# Patient Record
Sex: Male | Born: 1960 | Race: Black or African American | Hispanic: No | Marital: Single | Smoking: Never smoker
Health system: Southern US, Community
[De-identification: ages and names within clinical notes are randomized; demographics above are authoritative.]

## PROBLEM LIST (undated history)

## (undated) DIAGNOSIS — F4310 Post-traumatic stress disorder, unspecified: Secondary | ICD-10-CM

## (undated) DIAGNOSIS — I10 Essential (primary) hypertension: Secondary | ICD-10-CM

## (undated) DIAGNOSIS — C61 Malignant neoplasm of prostate: Secondary | ICD-10-CM

## (undated) HISTORY — PX: BACK SURGERY: SHX140

---

## 2020-09-17 ENCOUNTER — Other Ambulatory Visit: Payer: Self-pay

## 2020-09-17 ENCOUNTER — Emergency Department (HOSPITAL_COMMUNITY)
Admission: EM | Admit: 2020-09-17 | Discharge: 2020-09-17 | Disposition: A | Discharge status: Home / Self Care | Payer: Non-veteran care | Attending: Emergency Medicine | Admitting: Emergency Medicine

## 2020-09-17 ENCOUNTER — Encounter (HOSPITAL_COMMUNITY): Payer: Self-pay | Admitting: *Deleted

## 2020-09-17 DIAGNOSIS — R1030 Lower abdominal pain, unspecified: Secondary | ICD-10-CM | POA: Insufficient documentation

## 2020-09-17 DIAGNOSIS — Z5900 Homelessness unspecified: Secondary | ICD-10-CM | POA: Diagnosis not present

## 2020-09-17 DIAGNOSIS — N5089 Other specified disorders of the male genital organs: Secondary | ICD-10-CM | POA: Diagnosis present

## 2020-09-17 DIAGNOSIS — Z5321 Procedure and treatment not carried out due to patient leaving prior to being seen by health care provider: Secondary | ICD-10-CM | POA: Insufficient documentation

## 2020-09-17 HISTORY — DX: Post-traumatic stress disorder, unspecified: F43.10

## 2020-09-17 HISTORY — DX: Malignant neoplasm of prostate: C61

## 2020-09-17 HISTORY — DX: Essential (primary) hypertension: I10

## 2020-09-17 NOTE — ED Triage Notes (Addendum)
BIB EMS Perineal pain and swelling, lower abd pain 10/10 due to pressure. 160/104-80-98%-15 pt is homeless.

## 2020-09-20 ENCOUNTER — Emergency Department (HOSPITAL_COMMUNITY): Payer: No Typology Code available for payment source

## 2020-09-20 ENCOUNTER — Emergency Department (HOSPITAL_COMMUNITY)
Admission: EM | Admit: 2020-09-20 | Discharge: 2020-09-20 | Disposition: A | Discharge status: Home / Self Care | Payer: No Typology Code available for payment source | Attending: Emergency Medicine | Admitting: Emergency Medicine

## 2020-09-20 DIAGNOSIS — N453 Epididymo-orchitis: Secondary | ICD-10-CM | POA: Diagnosis not present

## 2020-09-20 DIAGNOSIS — N433 Hydrocele, unspecified: Secondary | ICD-10-CM | POA: Diagnosis not present

## 2020-09-20 DIAGNOSIS — I10 Essential (primary) hypertension: Secondary | ICD-10-CM | POA: Diagnosis not present

## 2020-09-20 DIAGNOSIS — R1909 Other intra-abdominal and pelvic swelling, mass and lump: Secondary | ICD-10-CM | POA: Diagnosis present

## 2020-09-20 DIAGNOSIS — N5089 Other specified disorders of the male genital organs: Secondary | ICD-10-CM

## 2020-09-20 DIAGNOSIS — Z8546 Personal history of malignant neoplasm of prostate: Secondary | ICD-10-CM | POA: Diagnosis not present

## 2020-09-20 LAB — URINALYSIS, ROUTINE W REFLEX MICROSCOPIC
Bilirubin Urine: NEGATIVE
Glucose, UA: NEGATIVE mg/dL
Ketones, ur: NEGATIVE mg/dL
Nitrite: POSITIVE — AB
Protein, ur: NEGATIVE mg/dL
Specific Gravity, Urine: 1.014 (ref 1.005–1.030)
pH: 6 (ref 5.0–8.0)

## 2020-09-20 LAB — COMPREHENSIVE METABOLIC PANEL
ALT: 19 U/L (ref 0–44)
AST: 25 U/L (ref 15–41)
Albumin: 3.9 g/dL (ref 3.5–5.0)
Alkaline Phosphatase: 42 U/L (ref 38–126)
Anion gap: 10 (ref 5–15)
BUN: 12 mg/dL (ref 6–20)
CO2: 26 mmol/L (ref 22–32)
Calcium: 9.3 mg/dL (ref 8.9–10.3)
Chloride: 104 mmol/L (ref 98–111)
Creatinine, Ser: 1.28 mg/dL — ABNORMAL HIGH (ref 0.61–1.24)
GFR, Estimated: >60 mL/min (ref >60)
Glucose, Bld: 95 mg/dL (ref 70–99)
Potassium: 4.4 mmol/L (ref 3.5–5.1)
Sodium: 140 mmol/L (ref 135–145)
Total Bilirubin: 0.3 mg/dL (ref 0.3–1.2)
Total Protein: 6.8 g/dL (ref 6.5–8.1)

## 2020-09-20 LAB — CBC WITH DIFFERENTIAL/PLATELET
Abs Immature Granulocytes: 0.03 10*3/uL (ref 0.00–0.07)
Basophils Absolute: 0.1 10*3/uL (ref 0.0–0.1)
Basophils Relative: 1 %
Eosinophils Absolute: 0.3 10*3/uL (ref 0.0–0.5)
Eosinophils Relative: 4 %
HCT: 41.5 % (ref 39.0–52.0)
Hemoglobin: 13.8 g/dL (ref 13.0–17.0)
Immature Granulocytes: 0 %
Lymphocytes Relative: 25 %
Lymphs Abs: 1.7 10*3/uL (ref 0.7–4.0)
MCH: 29.7 pg (ref 26.0–34.0)
MCHC: 33.3 g/dL (ref 30.0–36.0)
MCV: 89.2 fL (ref 80.0–100.0)
Monocytes Absolute: 0.7 10*3/uL (ref 0.1–1.0)
Monocytes Relative: 10 %
Neutro Abs: 4.1 10*3/uL (ref 1.7–7.7)
Neutrophils Relative %: 60 %
Platelets: 370 10*3/uL (ref 150–400)
RBC: 4.65 MIL/uL (ref 4.22–5.81)
RDW: 14.1 % (ref 11.5–15.5)
WBC: 6.9 10*3/uL (ref 4.0–10.5)
nRBC: 0 % (ref 0.0–0.2)

## 2020-09-20 LAB — URINALYSIS, MICROSCOPIC (REFLEX)

## 2020-09-20 LAB — LIPASE, BLOOD: Lipase: 28 U/L (ref 11–51)

## 2020-09-20 MED ORDER — MORPHINE SULFATE (PF) 4 MG/ML IV SOLN
4.0000 mg | Freq: Once | INTRAVENOUS | Status: AC
  Administered 2020-09-20: 4 mg via INTRAVENOUS
  Filled 2020-09-20: qty 1

## 2020-09-20 MED ORDER — LEVOFLOXACIN 750 MG PO TABS
750.0000 mg | ORAL_TABLET | Freq: Once | ORAL | Status: AC
  Administered 2020-09-20: 750 mg via ORAL
  Filled 2020-09-20: qty 1

## 2020-09-20 MED ORDER — LEVOFLOXACIN 750 MG PO TABS: 750.0000 mg | ORAL_TABLET | Freq: Every day | ORAL | 0 refills | Status: AC

## 2020-09-20 MED ORDER — HYDROCODONE-ACETAMINOPHEN 5-325 MG PO TABS: 1.0000 | ORAL_TABLET | ORAL | 0 refills | Status: DC | PRN

## 2020-09-20 NOTE — ED Notes (Signed)
Ultrasound at bedside

## 2020-09-20 NOTE — ED Triage Notes (Signed)
Pt presents with c/o testicle swelling and pain for 2 days. Pt reports his entire scrotum is swollen.

## 2020-09-20 NOTE — ED Provider Notes (Signed)
[facility]-EMERGENCY DEPT Provider Note   CSN: [id number] Arrival date & time: 09/20/20  0818     History Chief Complaint  Patient presents with  . Groin Swelling    Clifford Edwards is a 60 y.o. male.  The history is provided by the patient and medical records. No language interpreter was used.  Groin Pain This is a new problem. The current episode started more than 2 days ago. The problem occurs constantly. The problem has been rapidly worsening. Pertinent negatives include no chest pain, no abdominal pain, no headaches and no shortness of breath. Nothing aggravates the symptoms. Nothing relieves the symptoms. He has tried nothing for the symptoms. The treatment provided no relief.       Past Medical History:  Diagnosis Date  . Hypertension   . Prostate cancer (HCC)   . PTSD (post-traumatic stress disorder)     There are no problems to display for this patient.    No family history on file.  Social History   Tobacco Use  . Smoking status: Never Smoker  . Smokeless tobacco: Never Used  Substance Use Topics  . Alcohol use: Yes  . Drug use: Not Currently    Home Medications Prior to Admission medications   Not on File    Allergies    Penicillins  Review of Systems   Review of Systems  Constitutional: Negative for chills, diaphoresis, fatigue and fever.  HENT: Negative for congestion.   Eyes: Negative for visual disturbance.  Respiratory: Negative for cough, chest tightness, shortness of breath and wheezing.   Cardiovascular: Negative for chest pain, palpitations and leg swelling.  Gastrointestinal: Negative for abdominal pain, constipation, diarrhea, nausea and vomiting.  Genitourinary: Positive for scrotal swelling and testicular pain. Negative for decreased urine volume, dysuria, flank pain, frequency, penile pain and penile swelling.  Musculoskeletal: Negative for back pain, neck pain and neck stiffness.  Skin: Negative for rash and  wound.  Neurological: Negative for dizziness, weakness, light-headedness and headaches.  Psychiatric/Behavioral: Negative for agitation and confusion.  All other systems reviewed and are negative.   Physical Exam Updated Vital Signs BP (!) 177/114 (BP Location: Right Arm)   Pulse 83   Temp 98.4 F (36.9 C) (Oral)   Resp 18   SpO2 97%   Physical Exam Vitals and nursing note reviewed. Exam conducted with a chaperone present.  Constitutional:      General: He is not in acute distress.    Appearance: He is well-developed and well-nourished. He is not ill-appearing, toxic-appearing or diaphoretic.  HENT:     Head: Normocephalic and atraumatic.     Nose: No congestion or rhinorrhea.     Mouth/Throat:     Mouth: Mucous membranes are moist.     Pharynx: No oropharyngeal exudate or posterior oropharyngeal erythema.  Eyes:     Conjunctiva/sclera: Conjunctivae normal.  Cardiovascular:     Rate and Rhythm: Normal rate and regular rhythm.     Heart sounds: No murmur heard.   Pulmonary:     Effort: Pulmonary effort is normal. No respiratory distress.     Breath sounds: Normal breath sounds. No wheezing, rhonchi or rales.  Chest:     Chest wall: No tenderness.  Abdominal:     General: Abdomen is flat. There is no distension.     Palpations: Abdomen is soft.     Tenderness: There is no abdominal tenderness. There is no right CVA tenderness, left CVA tenderness, guarding or rebound.  Genitourinary:  Pubic Area: No rash.      Penis: No erythema.      Testes:        Right: Tenderness not present.        Left: Tenderness present.    Musculoskeletal:        General: No tenderness or edema.     Cervical back: Neck supple. No tenderness.  Skin:    General: Skin is warm and dry.     Capillary Refill: Capillary refill takes less than 2 seconds.     Findings: No erythema or rash.  Neurological:     General: No focal deficit present.     Mental Status: He is alert.  Psychiatric:         Mood and Affect: Mood and affect and mood normal.     ED Results / Procedures / Treatments   Labs (all labs ordered are listed, but only abnormal results are displayed) Labs Reviewed  COMPREHENSIVE METABOLIC PANEL - Abnormal; Notable for the following components:      Result Value   Creatinine, Ser 1.28 (*)    All other components within normal limits  URINALYSIS, ROUTINE W REFLEX MICROSCOPIC - Abnormal; Notable for the following components:   Hgb urine dipstick MODERATE (*)    Nitrite POSITIVE (*)    Leukocytes,Ua MODERATE (*)    All other components within normal limits  URINALYSIS, MICROSCOPIC (REFLEX) - Abnormal; Notable for the following components:   Bacteria, UA MANY (*)    All other components within normal limits  URINE CULTURE  CBC WITH DIFFERENTIAL/PLATELET  LIPASE, BLOOD  URINALYSIS, ROUTINE W REFLEX MICROSCOPIC    EKG None  Radiology US SCROTUM W/DOPPLER  Result Date: 09/20/2020 CLINICAL DATA:  BILATERAL scrotal pain and swelling for 2-3 days, pain greater on LEFT EXAM: SCROTAL ULTRASOUND DOPPLER ULTRASOUND OF THE TESTICLES TECHNIQUE: Complete ultrasound examination of the testicles, epididymis, and other scrotal structures was performed. Color and spectral Doppler ultrasound were also utilized to evaluate blood flow to the testicles. COMPARISON:  None FINDINGS: Right testicle Measurements: 3.4 x 1.5 x 3.1 cm. Normal echogenicity without mass or calcification. Internal blood flow present on color Doppler imaging. Left testicle Measurements: 4.4 x 2.4 x 2.9 cm. Normal echogenicity without mass or calcification. Internal blood flow present on color Doppler imaging. Blood flow within the LEFT testis is increased versus the RIGHT Right epididymis:  Normal in size and appearance. Left epididymis: Normal in size and appearance. Mildly hypervascular versus RIGHT Hydrocele: Small RIGHT hydrocele. Larger LEFT hydrocele which is complicated by multiple thin septations. No  hypervascularity within the septations or surrounding the LEFT hydrocele. No significant complexity/echogenicity of the internal fluid. Varicocele:  None visualized. Pulsed Doppler interrogation of both testes demonstrates normal low resistance arterial and venous waveforms bilaterally. IMPRESSION: No evidence of testicular mass or torsion. Hypervascular versus LEFT testis and epididymis consistent with LEFT epididymo-orchitis. Larger complicated LEFT hydrocele containing multiple thin septations, suspect mildly complicated LEFT hydrocele but cannot completely exclude pyocele though considered less likely. Small RIGHT hydrocele. Electronically Signed   By: Ulyses Southward M.D.   On: 09/20/2020 09:37    Procedures Procedures (including critical care time)  Medications Ordered in ED Medications  morphine 4 MG/ML injection 4 mg (4 mg Intravenous Given 09/20/20 0905)  levofloxacin (LEVAQUIN) tablet 750 mg (750 mg Oral Given 09/20/20 1218)    ED Course  I have reviewed the triage vital signs and the nursing notes.  Pertinent labs & imaging results that were available during  my care of the patient were reviewed by me and considered in my medical decision making (see chart for details).    MDM Rules/Calculators/A&P                          Ekene Barrington is a 60 y.o. male with a past medical history significant for hypertension, PTSD, prior back surgery, and report of prostate cancer who presents with groin pain.  Patient reports that for the last 3 days, he has had waxing waning but worsening pain in his left groin and scrotum.  He has never had this pain before.  He reports he has had cancer before and had some abdominal pain but he reports this feels different.  He reports no nausea or vomiting.  He reports he is having more difficult time with urination but it is not burning.  No purulence or pus.  No rashes on the groin.  He reports his groin and scrotum was bulging before but now it is not it is just  hurting now.  He denies any history of torsion.  Denies any history of groin trauma or accidents.  He reports no history of sexually transmitted infection or new sexual contacts.  He is not concerned about STI.  No history of hernia reported.  He reports he did not do any heavy lifting before this pain started.  He describes as 10 out of 10 in severity with some waxing and waning worsening over the last few days.  He denies penile ulcers or any redness of the skin.  Denies any fevers, chills, chest pain, shortness of breath, or URI symptoms.  Denies upper abdominal pain.  Reports he has had slightly less urine.   On exam with a chaperone, left hemiscrotum and inguinal area is tender to palpation.  It was difficult to do palpation of the inguinal area due to the patient's severe pain however I did not appreciate initial lymphadenopathy.  No tenderness to the penis or any ulcers or rashes seen.  Left hemiscrotum is tender and it is difficult to assess cremaster reflex.  Right scrotum is nontender.  Right inguinal area nontender.  Abdomen otherwise nontender with normal bowel sounds.  Flanks nontender.  No rashes.  Patient will be given pain medicine and will have a testicular ultrasound.  We will get urinalysis and culture due to the change in urine amount.  We will get screening labs as well.  Anticipate reassessment after work-up.  10:16 AM Ultrasound returned and does not show evidence of testicular mass or torsion but does show hypervascular left testis and epididymis concerning for left epididymoorchitis.  There is also a complicated left hydrocele containing septations which cannot exclude a pyocele.  We will touch base with urology to discuss if further imaging such as CT would be needed.  Labs began to return and or grossly reassuring aside from slight elevation in creatinine of 1.28 (normal GFR.  No leukocytosis and no fever.  No other infectious symptoms reported.  10:51 AM Urology was called  who looked at the ultrasound images himself.  He suspects this is just a hydrocele with no evidence of abscess or pyocele.  He suspects this is all the epididymoorchitis and associated edema.  He recommended antibiotics for the patient and follow-up in clinic.  He does not feel CT imaging or further work-up is needed at this time.  Patient agreed with this plan, he is awaiting the urinalysis results.  He will be  given a dose of Levaquin and given a prescription for Levaquin and pain medication.  Patient agrees with plan and will follow up with his [facility] team.  I will also give him the outpatient [facility] team information [facility].    Final Clinical Impression(s) / ED Diagnoses Final diagnoses:  Epididymo-orchitis without abscess  Scrotal swelling  Hydrocele in adult    Rx / DC Orders ED Discharge Orders         Ordered    levofloxacin (LEVAQUIN) 750 MG tablet  Daily        09/20/20 1211    HYDROcodone-acetaminophen (NORCO/VICODIN) 5-325 MG tablet  Every 4 hours PRN        09/20/20 1211         Clinical Impression: 1. Epididymo-orchitis without abscess   2. Scrotal swelling   3. Hydrocele in adult     Disposition: Discharge  Condition: Good  I have discussed the results, Dx and Tx plan with the pt(& family if present). He/she/they expressed understanding and agree(s) with the plan. Discharge instructions discussed at great length. Strict return precautions discussed and pt &/or family have verbalized understanding of the instructions. No further questions at time of discharge.    Discharge Medication List as of 09/20/2020 12:13 PM    START taking these medications   Details  HYDROcodone-acetaminophen (NORCO/VICODIN) 5-325 MG tablet Take 1 tablet by mouth every 4 (four) hours as needed., Starting Mon 09/20/2020, Normal    levofloxacin (LEVAQUIN) 750 MG tablet Take 1 tablet (750 mg total) by mouth daily for 10 days., Starting Mon 09/20/2020, Until Thu 09/30/2020,  Print        Follow Up: Center, [facility] [place] [id number] [phone]     [place] urology team        Daneisha Surges, Gwenyth Allegra, MD 09/20/20 1230

## 2020-09-20 NOTE — Discharge Instructions (Signed)
Your ultrasound work-up today are consistent with an epididymoorchitis or infection of the testicle and epididymis with associated fluid causing a hydrocele.  The ultrasound was reviewed by the urology team and they do not suspect abscess or surgical problem at this time.  Please take antibiotics for the next 10 days to treat this.  Please use the pain medicine to help with your discomfort.  Please follow-up with your outpatient urology team with the [facility] and if for some reason you cannot get in, consider calling the [facility] here in [place].  If any symptoms change or worsen, please return to the nearest emergency department.

## 2020-09-22 LAB — URINE CULTURE: Culture: >=100000 — AB

## 2020-09-23 ENCOUNTER — Telehealth: Payer: Self-pay | Admitting: Emergency Medicine

## 2020-09-23 NOTE — Telephone Encounter (Signed)
Post ED Visit - Positive Culture Follow-up  Culture report reviewed by antimicrobial stewardship pharmacist: [facility] []  , Pharm.D. []  Enzo Bi, Pharm.D., BCPS AQ-ID []  , Pharm.D., BCPS []  Celedonio Miyamoto, Pharm.D., BCPS []  [place], Garvin Fila.D., BCPS, AAHIVP []  , Pharm.D., BCPS, AAHIVP []  Georgina Pillion, PharmD, BCPS []  , PharmD, BCPS []  [place], PharmD, BCPS []  [address], PharmD []  , PharmD, BCPS []  Estella Husk, PharmD  Pharmacy Team []  Lysle Pearl, PharmD []  , PharmD []  Phillips Climes, PharmD []  , Rph []  Agapito Games) , PharmD []  Verlan Friends, PharmD []  , PharmD []  Mervyn Gay, PharmD []  , PharmD []  Vinnie Level, PharmD []  [facility], PharmD []  , PharmD []  Len Childs, PharmD PharmD   Positive urine culture Treated with levofloxacin, organism sensitive to the same and no further patient follow-up is required at this time.  Greer Pickerel 09/23/2020, 9:48 AM

## 2021-02-19 ENCOUNTER — Encounter (HOSPITAL_COMMUNITY): Payer: Self-pay

## 2021-02-19 ENCOUNTER — Emergency Department (HOSPITAL_COMMUNITY): Payer: No Typology Code available for payment source

## 2021-02-19 ENCOUNTER — Emergency Department (HOSPITAL_COMMUNITY)
Admission: EM | Admit: 2021-02-19 | Discharge: 2021-02-21 | Disposition: A | Discharge status: Home / Self Care | Payer: No Typology Code available for payment source | Attending: Emergency Medicine | Admitting: Emergency Medicine

## 2021-02-19 DIAGNOSIS — R079 Chest pain, unspecified: Secondary | ICD-10-CM | POA: Diagnosis not present

## 2021-02-19 DIAGNOSIS — R440 Auditory hallucinations: Secondary | ICD-10-CM | POA: Diagnosis present

## 2021-02-19 DIAGNOSIS — F4312 Post-traumatic stress disorder, chronic: Secondary | ICD-10-CM | POA: Insufficient documentation

## 2021-02-19 DIAGNOSIS — Z20822 Contact with and (suspected) exposure to covid-19: Secondary | ICD-10-CM | POA: Diagnosis not present

## 2021-02-19 DIAGNOSIS — R45850 Homicidal ideations: Secondary | ICD-10-CM | POA: Insufficient documentation

## 2021-02-19 LAB — BASIC METABOLIC PANEL
Anion gap: 7 (ref 5–15)
BUN: 13 mg/dL (ref 6–20)
CO2: 27 mmol/L (ref 22–32)
Calcium: 9.1 mg/dL (ref 8.9–10.3)
Chloride: 102 mmol/L (ref 98–111)
Creatinine, Ser: 1.06 mg/dL (ref 0.61–1.24)
GFR, Estimated: >60 mL/min (ref >60)
Glucose, Bld: 87 mg/dL (ref 70–99)
Potassium: 4 mmol/L (ref 3.5–5.1)
Sodium: 136 mmol/L (ref 135–145)

## 2021-02-19 LAB — ACETAMINOPHEN LEVEL: Acetaminophen (Tylenol), Serum: <10 ug/mL — ABNORMAL LOW (ref 10–30)

## 2021-02-19 LAB — RAPID URINE DRUG SCREEN, HOSP PERFORMED
Amphetamines: NOT DETECTED
Barbiturates: NOT DETECTED
Benzodiazepines: NOT DETECTED
Cocaine: POSITIVE — AB
Opiates: NOT DETECTED
Tetrahydrocannabinol: POSITIVE — AB

## 2021-02-19 LAB — CBC WITH DIFFERENTIAL/PLATELET
Abs Immature Granulocytes: 0.02 10*3/uL (ref 0.00–0.07)
Basophils Absolute: 0 10*3/uL (ref 0.0–0.1)
Basophils Relative: 1 %
Eosinophils Absolute: 0.2 10*3/uL (ref 0.0–0.5)
Eosinophils Relative: 4 %
HCT: 39.5 % (ref 39.0–52.0)
Hemoglobin: 13.3 g/dL (ref 13.0–17.0)
Immature Granulocytes: 0 %
Lymphocytes Relative: 30 %
Lymphs Abs: 1.8 10*3/uL (ref 0.7–4.0)
MCH: 30.4 pg (ref 26.0–34.0)
MCHC: 33.7 g/dL (ref 30.0–36.0)
MCV: 90.4 fL (ref 80.0–100.0)
Monocytes Absolute: 0.5 10*3/uL (ref 0.1–1.0)
Monocytes Relative: 9 %
Neutro Abs: 3.4 10*3/uL (ref 1.7–7.7)
Neutrophils Relative %: 56 %
Platelets: 283 10*3/uL (ref 150–400)
RBC: 4.37 MIL/uL (ref 4.22–5.81)
RDW: 14.1 % (ref 11.5–15.5)
WBC: 5.9 10*3/uL (ref 4.0–10.5)
nRBC: 0 % (ref 0.0–0.2)

## 2021-02-19 LAB — RESP PANEL BY RT-PCR (FLU A&B, COVID) ARPGX2
Influenza A by PCR: NEGATIVE
Influenza B by PCR: NEGATIVE
SARS Coronavirus 2 by RT PCR: NEGATIVE

## 2021-02-19 LAB — ETHANOL: Alcohol, Ethyl (B): <10 mg/dL (ref <10)

## 2021-02-19 LAB — TROPONIN I (HIGH SENSITIVITY)
Troponin I (High Sensitivity): 9 ng/L (ref <18)
Troponin I (High Sensitivity): 10 ng/L (ref <18)

## 2021-02-19 MED ORDER — ALUM & MAG HYDROXIDE-SIMETH 200-200-20 MG/5ML PO SUSP: 30.0000 mL | Freq: Four times a day (QID) | ORAL | Status: DC | PRN

## 2021-02-19 MED ORDER — STERILE WATER FOR INJECTION IJ SOLN
INTRAMUSCULAR | Status: AC
  Filled 2021-02-19: qty 10

## 2021-02-19 MED ORDER — VITAMIN D 25 MCG (1000 UNIT) PO TABS
1000.0000 [IU] | ORAL_TABLET | Freq: Every day | ORAL | Status: DC
  Administered 2021-02-20 – 2021-02-21 (×2): 1000 [IU] via ORAL
  Filled 2021-02-19 (×2): qty 1

## 2021-02-19 MED ORDER — IBUPROFEN 800 MG PO TABS
800.0000 mg | ORAL_TABLET | Freq: Three times a day (TID) | ORAL | Status: DC | PRN
  Administered 2021-02-20 – 2021-02-21 (×2): 800 mg via ORAL
  Filled 2021-02-19 (×3): qty 1

## 2021-02-19 MED ORDER — ASPIRIN 81 MG PO CHEW
324.0000 mg | CHEWABLE_TABLET | Freq: Once | ORAL | Status: DC
  Filled 2021-02-19: qty 4

## 2021-02-19 MED ORDER — NICOTINE 21 MG/24HR TD PT24
21.0000 mg | MEDICATED_PATCH | Freq: Every day | TRANSDERMAL | Status: DC
  Administered 2021-02-21: 21 mg via TRANSDERMAL
  Filled 2021-02-19 (×2): qty 1

## 2021-02-19 MED ORDER — ZIPRASIDONE MESYLATE 20 MG IM SOLR
20.0000 mg | Freq: Once | INTRAMUSCULAR | Status: AC
  Administered 2021-02-19: 20 mg via INTRAMUSCULAR
  Filled 2021-02-19: qty 20

## 2021-02-19 MED ORDER — OMEGA-3-ACID ETHYL ESTERS 1 G PO CAPS
1.0000 g | ORAL_CAPSULE | Freq: Two times a day (BID) | ORAL | Status: DC
  Administered 2021-02-19 – 2021-02-21 (×4): 1 g via ORAL
  Filled 2021-02-19 (×5): qty 1

## 2021-02-19 MED ORDER — DICLOFENAC SODIUM 1 % EX GEL
4.0000 g | Freq: Four times a day (QID) | CUTANEOUS | Status: DC | PRN
  Administered 2021-02-20: 4 g via TOPICAL
  Filled 2021-02-19: qty 100

## 2021-02-19 MED ORDER — ONDANSETRON HCL 4 MG PO TABS: 4.0000 mg | ORAL_TABLET | Freq: Three times a day (TID) | ORAL | Status: DC | PRN

## 2021-02-19 MED ORDER — TRAZODONE HCL 50 MG PO TABS
50.0000 mg | ORAL_TABLET | Freq: Every day | ORAL | Status: DC
  Administered 2021-02-20: 50 mg via ORAL
  Filled 2021-02-19: qty 1

## 2021-02-19 MED ORDER — KETOROLAC TROMETHAMINE 15 MG/ML IJ SOLN
15.0000 mg | Freq: Once | INTRAMUSCULAR | Status: AC
  Administered 2021-02-19: 15 mg via INTRAVENOUS
  Filled 2021-02-19: qty 1

## 2021-02-19 MED ORDER — DULOXETINE HCL 30 MG PO CPEP
30.0000 mg | ORAL_CAPSULE | Freq: Every day | ORAL | Status: DC
  Administered 2021-02-20: 30 mg via ORAL
  Filled 2021-02-19 (×2): qty 1

## 2021-02-19 MED ORDER — IBUPROFEN 800 MG PO TABS: 800.0000 mg | ORAL_TABLET | Freq: Three times a day (TID) | ORAL | Status: DC | PRN

## 2021-02-19 MED ORDER — HYDRALAZINE HCL 10 MG PO TABS
10.0000 mg | ORAL_TABLET | Freq: Two times a day (BID) | ORAL | Status: DC
  Administered 2021-02-20 – 2021-02-21 (×3): 10 mg via ORAL
  Filled 2021-02-19 (×3): qty 1

## 2021-02-19 MED ORDER — AMLODIPINE BESYLATE 5 MG PO TABS
10.0000 mg | ORAL_TABLET | Freq: Every day | ORAL | Status: DC
  Administered 2021-02-20 – 2021-02-21 (×2): 10 mg via ORAL
  Filled 2021-02-19 (×2): qty 2

## 2021-02-19 MED ORDER — ACETAMINOPHEN 325 MG PO TABS: 650.0000 mg | ORAL_TABLET | ORAL | Status: DC | PRN

## 2021-02-19 MED ORDER — QUETIAPINE FUMARATE 200 MG PO TABS
200.0000 mg | ORAL_TABLET | Freq: Every day | ORAL | Status: DC
  Administered 2021-02-20: 200 mg via ORAL
  Filled 2021-02-19 (×3): qty 1

## 2021-02-19 MED ORDER — TAMSULOSIN HCL 0.4 MG PO CAPS
0.4000 mg | ORAL_CAPSULE | Freq: Every day | ORAL | Status: DC
  Administered 2021-02-20 – 2021-02-21 (×2): 0.4 mg via ORAL
  Filled 2021-02-19 (×2): qty 1

## 2021-02-19 MED ORDER — HYDROCHLOROTHIAZIDE 25 MG PO TABS
12.5000 mg | ORAL_TABLET | Freq: Every day | ORAL | Status: DC
  Administered 2021-02-20 – 2021-02-21 (×2): 12.5 mg via ORAL
  Filled 2021-02-19 (×2): qty 1

## 2021-02-19 NOTE — ED Provider Notes (Signed)
Emergency Medicine Provider Triage Evaluation Note  Clifford Edwards , a 60 y.o. male  was evaluated in triage.  Pt complains of chest pain and homicidal ideation with plan. Psych history managed at [place].  Ran out of medicines.  Thinking of using his fire arms to kill people. Called his brother to take them away.  Sudden onset left burning chest pain constant. Denies SI. Also reports pain all over his body where he has had surgeries in neck, thoracic and lumbar back and right knee   Review of Systems  Positive: CP, HI Negative: SI  Physical Exam  There were no vitals taken for this visit. Gen:   Awake, no distress   Resp:  Normal effort  MSK:   Moves extremities without difficulty  Cardiac: RRR. Normal skin over chest    Medical Decision Making    Patient made aware this encounter is a triage and screening encounter and no beds are immediately available at this time in the ER.  Patient was informed that the remainder of the evaluation will be completed by another provider.  Patient made aware triage orders have been placed and patient will be placed in the waiting room while work up is initiated and until a room becomes available. Patient encouraged to await a formal ER encounter with a clinician.  Patient made aware that exiting the department prior to formal encounter with an ER clinician and completion of the work-up is considered leaving against medical advice.  At that time there is no guarantee that there are no emergency medical conditions present and patient assumes risks of leaving including worsening condition, permanent disability and death. Patient verbalizes understanding.     Kinnie Feil, PA-C 02/19/21 1443    Valarie Merino, MD 02/20/21 [phone]

## 2021-02-19 NOTE — ED Notes (Signed)
Pt denying CP.
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
## 2021-02-19 NOTE — ED Notes (Signed)
Patient changed into purple scrubs. 2 bag to locker 14, Patient wanded  by security.

## 2021-02-19 NOTE — ED Notes (Signed)
Geodon IM given for agitation / attempting to leave , security assisted to hold patient .

## 2021-02-19 NOTE — ED Provider Notes (Signed)
[facility] EMERG[facility] DEPARTMENT Provider Note   CSN: [id number] Arrival date & time: 02/19/21  1405     History Chief Complaint  Patient presents with  . BH/HI/CP    Clifford Edwards is a 60 y.o. male.  HPI 60 year old male presents needing psychiatric help.  For the last 2 days he has been out of his meds which she states tends to happen because of issues with the mailing process.  He cannot tell me which meds he is on and states there should be a list.  Unfortunately this is not in our med rec.  He is feeling like he wants to hurt people and has been having auditory and visual hallucinations.  This is a recurring issue whenever he runs out of his meds.  He also states that he is been having some left lateral chest pain all day today.  Feels like it is bruised.  Past Medical History:  Diagnosis Date  . Hypertension   . Prostate cancer ([place])   . PTSD (post-traumatic stress disorder)     There are no problems to display for this patient.   Past Surgical History:  Procedure Laterality Date  . BACK SURGERY         No family history on file.  Social History   Tobacco Use  . Smoking status: Never Smoker  . Smokeless tobacco: Never Used  Substance Use Topics  . Alcohol use: Yes  . Drug use: Not Currently    Home Medications Prior to Admission medications   Medication Sig Start Date End Date Taking? Authorizing Provider  amLODipine (NORVASC) 10 MG tablet Take 10 mg by mouth daily.    [provider]  Cholecalciferol (VITAMIN D3) 25 MCG (1000 UT) CAPS Take 1,000 Units by mouth daily.    [provider]  DULoxetine (CYMBALTA) 30 MG capsule Take 30 mg by mouth daily with supper.    [provider]  hydrALAZINE (APRESOLINE) 10 MG tablet Take 10 mg by mouth 2 (two) times daily.    [provider]  hydrochlorothiazide (HYDRODIURIL) 12.5 MG tablet Take 12.5 mg by mouth daily.    [provider]   HYDROcodone-acetaminophen (NORCO/VICODIN) 5-325 MG tablet Take 1 tablet by mouth every 4 (four) hours as needed. 09/20/20   Tegeler, Gwenyth Allegra, MD  ibuprofen (ADVIL) 800 MG tablet Take 800 mg by mouth 3 (three) times daily as needed (for pain and/or inflammation).    [provider]  Omega-3 Fatty Acids (FISH OIL) 1000 MG CAPS Take 1,000 mg by mouth daily.    [provider]  oxyCODONE-acetaminophen (PERCOCET/ROXICET) 5-325 MG tablet Take 1 tablet by mouth every 8 (eight) hours as needed for severe pain or moderate pain.    [provider]  QUEtiapine (SEROQUEL) 400 MG tablet Take 200 mg by mouth at bedtime.    [provider]  tamsulosin (FLOMAX) 0.4 MG CAPS capsule Take 0.4 mg by mouth daily.    [provider]  traZODone (DESYREL) 50 MG tablet Take 50 mg by mouth at bedtime.    [provider]  VOLTAREN 1 % GEL Apply 4 g topically See admin instructions. Apply 4 grams to painful sites two times a day and at bedtime    [provider]    Allergies    Penicillins  Review of Systems   Review of Systems  Cardiovascular: Positive for chest pain.  Gastrointestinal: Negative for abdominal pain.  Psychiatric/Behavioral: Positive for hallucinations. Negative for suicidal ideas.  All other systems reviewed and are negative.   Physical Exam Updated Vital Signs BP (!) 157/106 (BP Location: Right Arm)   Pulse 68   Temp 98 F (36.7 C) (Oral)   Resp 16   SpO2 100%   Physical Exam Vitals and nursing note reviewed.  Constitutional:      General: He is not in acute distress.    Appearance: He is well-developed. He is not ill-appearing or diaphoretic.  HENT:     Head: Normocephalic and atraumatic.     Right Ear: External ear normal.     Left Ear: External ear normal.     Nose: Nose normal.  Eyes:     General:        Right eye: No discharge.        Left eye: No discharge.  Cardiovascular:     Rate and Rhythm: Normal rate  and regular rhythm.     Heart sounds: Normal heart sounds.  Pulmonary:     Effort: Pulmonary effort is normal.     Breath sounds: Normal breath sounds.  Abdominal:     Palpations: Abdomen is soft.     Tenderness: There is no abdominal tenderness.  Musculoskeletal:     Cervical back: Neck supple.  Skin:    General: Skin is warm and dry.  Neurological:     Mental Status: He is alert.  Psychiatric:        Mood and Affect: Mood is not anxious.        Thought Content: Thought content does not include suicidal ideation. Thought content does not include suicidal plan.     ED Results / Procedures / Treatments   Labs (all labs ordered are listed, but only abnormal results are displayed) Labs Reviewed  ACETAMINOPHEN LEVEL - Abnormal; Notable for the following components:      Result Value   Acetaminophen (Tylenol), Serum <10 (*)    All other components within normal limits  RAPID URINE DRUG SCREEN, HOSP PERFORMED - Abnormal; Notable for the following components:   Cocaine POSITIVE (*)    Tetrahydrocannabinol POSITIVE (*)    All other components within normal limits  RESP PANEL BY RT-PCR (FLU A&B, COVID) ARPGX2  BASIC METABOLIC PANEL  CBC WITH DIFFERENTIAL/PLATELET  ETHANOL  TROPONIN I (HIGH SENSITIVITY)  TROPONIN I (HIGH SENSITIVITY)    EKG EKG Interpretation  Date/Time:  Saturday February 19 2021 14:28:03 EDT Ventricular Rate:  73 PR Interval:  138 QRS Duration: 86 QT Interval:  392 QTC Calculation: 431 R Axis:   64 Text Interpretation: Sinus rhythm with Premature atrial complexes Otherwise normal ECG Confirmed by Sherwood Gambler [phone]) on 02/19/2021 4:37:01 PM   Radiology DG Chest 2 View  Result Date: 02/19/2021 CLINICAL DATA:  Chest pain EXAM: CHEST - 2 VIEW COMPARISON:  None. FINDINGS: There is a small granuloma in the right apex. There is no edema or airspace opacity. Heart size and pulmonary vascularity are normal. No adenopathy. There is postoperative change in the  lower cervical spine. There are foci of degenerative change in the thoracic spine. IMPRESSION: No edema or airspace opacity. Calcified granuloma right apex. Cardiac silhouette normal. No evident adenopathy. Electronically Signed   By: Lowella Grip III M.D.   On: 02/19/2021 15:21    Procedures Procedures   Medications Ordered in ED Medications  aspirin chewable tablet 324 mg (324 mg Oral Not Given 02/19/21 1919)  acetaminophen (TYLENOL) tablet 650 mg (has no administration in time range)  ondansetron (ZOFRAN) tablet 4 mg (  has no administration in time range)  alum & mag hydroxide-simeth (MAALOX/MYLANTA) 200-200-20 MG/5ML suspension 30 mL (has no administration in time range)  nicotine (NICODERM CQ - dosed in mg/24 hours) patch 21 mg (21 mg Transdermal Not Given 02/19/21 1919)  ketorolac (TORADOL) 15 MG/ML injection 15 mg (15 mg Intravenous Given 02/19/21 1920)    ED Course  I have reviewed the triage vital signs and the nursing notes.  Pertinent labs & imaging results that were available during my care of the patient were reviewed by me and considered in my medical decision making (see chart for details).    MDM Rules/Calculators/A&P                          Patient's medical work-up is unremarkable and he appears stable for psychiatric disposition.  Troponins are negative x2.  Otherwise he is not agitated here.  We will reorder home meds. Final Clinical Impression(s) / ED Diagnoses Final diagnoses:  Homicidal ideation  Nonspecific chest pain    Rx / DC Orders ED Discharge Orders    None       Sherwood Gambler, MD 02/19/21 2324

## 2021-02-19 NOTE — ED Triage Notes (Signed)
Per EMS [facility] at [facility] today and was asked to not come back. Patient then complains of CP and made suicidal threats to [facility]. Patient has made SI/HI statements to EMS as well while enroute

## 2021-02-20 NOTE — ED Notes (Signed)
Pt currently eating dinner. TTS counselor would like to reassess. Told them I would place pt in a room w/ TTS when I get a moment to.

## 2021-02-20 NOTE — Progress Notes (Signed)
Per Letitia Libra FNP, patient meets criteria for inpatient treatment. There are no available beds at [facility] currently. CSW faxed referrals to the following facilities for review:  [facility]  TTS will continue to seek bed placement.   Maxie Better, MSW, LCSW Clinical Social Worker 02/20/2021 8:12 AM
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
## 2021-02-20 NOTE — ED Notes (Signed)
Pt in 48 being TTS'd at this time

## 2021-02-20 NOTE — Progress Notes (Signed)
CSW spoke with Roselyn Reef in admissions at [facility]. Pt has been accepted for admission for arrival AFTER 8:00AM on Monday, 6/6 to [facility]. Accepting provider is Dr. Sharlene Motts MD. Number for report is: [phone]. CSW spoke with Sammuel Bailiff RN and provided her with the above information.    Marisela Line S. Ouida Sills, MSW, LCSW Clinical Social Worker 02/20/2021 9:50 AM

## 2021-02-20 NOTE — ED Notes (Signed)
Notified counselor that pt is ready for TTS

## 2021-02-20 NOTE — ED Notes (Signed)
Attempted to call pt's son per pt request to notify pt's son that he is here and not missing or dead. No answer for the number in the chart and pt gave me another number that was a number for a tax place.

## 2021-02-20 NOTE — ED Notes (Signed)
[facility] tomorrow after [address]. Accepting provider Jonelle Sports MD.

## 2021-02-20 NOTE — ED Notes (Signed)
Inpt tx Breakfast Ordered

## 2021-02-21 NOTE — ED Notes (Signed)
From TTS note yesterdaty:   Disposition:  Merlyn Lot, NP, continues to recommend inpatient treatment. "CSW spoke with Roselyn Reef in admissions at [facility]. Pt has beenaccepted for admission for arrival AFTER 8:00AM on Monday, 6/6 to [facility].Accepting provider is Dr. Sharlene Motts MD. Number for report is: [phone]. CSW spoke with Sammuel Bailiff RN and provided her with the above information"  Pt sleeping/ resting, NAD, calm, interactive, breakfast tray arrived. Reports generalized pain 10/10.

## 2021-02-21 NOTE — ED Notes (Signed)
Pt resting comfortably in bed. Pt calm and cooperative with staff  Respirations are even and non-labored, pt does not appear in distress

## 2021-02-21 NOTE — Discharge Instructions (Addendum)
Transfer to [facility] 

## 2021-02-21 NOTE — ED Notes (Signed)
Checked in with security to make sure no belonging were locked up

## 2021-02-21 NOTE — ED Provider Notes (Signed)
Emergency Medicine Observation Re-evaluation Note  Clifford Edwards is a 60 y.o. male, seen on rounds today.  Pt initially presented to the ED for complaints of behavioral health symptoms. Patient has been accepted to [facility]. No new c/o this AM. Pt alert, content.   Physical Exam  BP 120/64 (BP Location: Left Arm)   Pulse 78   Temp 97.7 F (36.5 C) (Oral)   Resp 16   Ht 1.829 m (6')   Wt 91.6 kg   SpO2 94%   BMI 27.39 kg/m  Physical Exam General: alert, content.  Cardiac: regular rate.  Lungs: breathing comfortably. Psych: calm, alert.   ED Course / MDM    I have reviewed the labs performed to date as well as medications administered while in observation.  Recent changes in the last 24 hours include stabilization on BH meds and placement.   Plan  Current plan is for transfer to [facility], pt accepted this AM, Dr Manus Rudd.   Pt current appears stable for transfer.     Lajean Saver, MD 02/21/21 [phone]

## 2021-02-21 NOTE — ED Notes (Signed)
Belongings, 2 bags from locker 14 retrieved, to go with pt.

## 2021-02-21 NOTE — ED Notes (Signed)
Sent in paperwork for safe transport.

## 2021-02-21 NOTE — ED Notes (Signed)
Transport no longer here. Pt brought back to room 42. Pending transport. No changes.

## 2021-02-21 NOTE — ED Notes (Signed)
Pt ambulatory, NAD, calm, interactive, steady gait. Out with safe transport.

## 2021-05-02 ENCOUNTER — Other Ambulatory Visit: Payer: Self-pay

## 2021-05-02 ENCOUNTER — Emergency Department (HOSPITAL_COMMUNITY)
Admission: EM | Admit: 2021-05-02 | Discharge: 2021-05-05 | Disposition: A | Discharge status: Other Acute Inpatient Hospital | Payer: No Typology Code available for payment source | Attending: Emergency Medicine | Admitting: Emergency Medicine

## 2021-05-02 ENCOUNTER — Ambulatory Visit (HOSPITAL_COMMUNITY)
Admission: EM | Admit: 2021-05-02 | Discharge: 2021-05-02 | Disposition: A | Discharge status: Other Acute Inpatient Hospital | Payer: No Typology Code available for payment source | Attending: Student in an Organized Health Care Education/Training Program | Admitting: Student in an Organized Health Care Education/Training Program

## 2021-05-02 DIAGNOSIS — I10 Essential (primary) hypertension: Secondary | ICD-10-CM | POA: Diagnosis not present

## 2021-05-02 DIAGNOSIS — R45850 Homicidal ideations: Secondary | ICD-10-CM

## 2021-05-02 DIAGNOSIS — Y900 Blood alcohol level of less than 20 mg/100 ml: Secondary | ICD-10-CM | POA: Insufficient documentation

## 2021-05-02 DIAGNOSIS — M545 Low back pain, unspecified: Secondary | ICD-10-CM | POA: Insufficient documentation

## 2021-05-02 DIAGNOSIS — G8929 Other chronic pain: Secondary | ICD-10-CM | POA: Diagnosis not present

## 2021-05-02 DIAGNOSIS — R39198 Other difficulties with micturition: Secondary | ICD-10-CM

## 2021-05-02 DIAGNOSIS — F332 Major depressive disorder, recurrent severe without psychotic features: Secondary | ICD-10-CM | POA: Insufficient documentation

## 2021-05-02 DIAGNOSIS — Z20822 Contact with and (suspected) exposure to covid-19: Secondary | ICD-10-CM | POA: Insufficient documentation

## 2021-05-02 DIAGNOSIS — Z79899 Other long term (current) drug therapy: Secondary | ICD-10-CM | POA: Diagnosis not present

## 2021-05-02 DIAGNOSIS — F4310 Post-traumatic stress disorder, unspecified: Secondary | ICD-10-CM

## 2021-05-02 DIAGNOSIS — Z8546 Personal history of malignant neoplasm of prostate: Secondary | ICD-10-CM | POA: Diagnosis not present

## 2021-05-02 LAB — COMPREHENSIVE METABOLIC PANEL
ALT: 20 U/L (ref 0–44)
AST: 29 U/L (ref 15–41)
Albumin: 3.9 g/dL (ref 3.5–5.0)
Alkaline Phosphatase: 44 U/L (ref 38–126)
Anion gap: 8 (ref 5–15)
BUN: 13 mg/dL (ref 6–20)
CO2: 26 mmol/L (ref 22–32)
Calcium: 9.8 mg/dL (ref 8.9–10.3)
Chloride: 103 mmol/L (ref 98–111)
Creatinine, Ser: 0.99 mg/dL (ref 0.61–1.24)
GFR, Estimated: >60 mL/min (ref >60)
Glucose, Bld: 92 mg/dL (ref 70–99)
Potassium: 3.8 mmol/L (ref 3.5–5.1)
Sodium: 137 mmol/L (ref 135–145)
Total Bilirubin: 0.5 mg/dL (ref 0.3–1.2)
Total Protein: 6.3 g/dL — ABNORMAL LOW (ref 6.5–8.1)

## 2021-05-02 LAB — ACETAMINOPHEN LEVEL: Acetaminophen (Tylenol), Serum: <10 ug/mL — ABNORMAL LOW (ref 10–30)

## 2021-05-02 LAB — CBC WITH DIFFERENTIAL/PLATELET
Abs Immature Granulocytes: 0.01 10*3/uL (ref 0.00–0.07)
Basophils Absolute: 0 10*3/uL (ref 0.0–0.1)
Basophils Relative: 1 %
Eosinophils Absolute: 0.1 10*3/uL (ref 0.0–0.5)
Eosinophils Relative: 2 %
HCT: 42 % (ref 39.0–52.0)
Hemoglobin: 14.2 g/dL (ref 13.0–17.0)
Immature Granulocytes: 0 %
Lymphocytes Relative: 45 %
Lymphs Abs: 2.4 10*3/uL (ref 0.7–4.0)
MCH: 30.3 pg (ref 26.0–34.0)
MCHC: 33.8 g/dL (ref 30.0–36.0)
MCV: 89.7 fL (ref 80.0–100.0)
Monocytes Absolute: 0.4 10*3/uL (ref 0.1–1.0)
Monocytes Relative: 7 %
Neutro Abs: 2.5 10*3/uL (ref 1.7–7.7)
Neutrophils Relative %: 45 %
Platelets: 279 10*3/uL (ref 150–400)
RBC: 4.68 MIL/uL (ref 4.22–5.81)
RDW: 14 % (ref 11.5–15.5)
WBC: 5.4 10*3/uL (ref 4.0–10.5)
nRBC: 0 % (ref 0.0–0.2)

## 2021-05-02 LAB — RESP PANEL BY RT-PCR (FLU A&B, COVID) ARPGX2
Influenza A by PCR: NEGATIVE
Influenza B by PCR: NEGATIVE
SARS Coronavirus 2 by RT PCR: NEGATIVE

## 2021-05-02 LAB — SALICYLATE LEVEL: Salicylate Lvl: <7 mg/dL — ABNORMAL LOW (ref 7.0–30.0)

## 2021-05-02 LAB — ETHANOL: Alcohol, Ethyl (B): <10 mg/dL (ref <10)

## 2021-05-02 MED ORDER — IBUPROFEN 400 MG PO TABS
600.0000 mg | ORAL_TABLET | Freq: Once | ORAL | Status: AC
  Administered 2021-05-02: 600 mg via ORAL
  Filled 2021-05-02: qty 1

## 2021-05-02 MED ORDER — AMLODIPINE BESYLATE 5 MG PO TABS
10.0000 mg | ORAL_TABLET | Freq: Every day | ORAL | Status: DC
  Administered 2021-05-02 – 2021-05-05 (×4): 10 mg via ORAL
  Filled 2021-05-02 (×4): qty 2

## 2021-05-02 MED ORDER — HYDRALAZINE HCL 10 MG PO TABS
10.0000 mg | ORAL_TABLET | Freq: Two times a day (BID) | ORAL | Status: DC
  Administered 2021-05-02 – 2021-05-05 (×6): 10 mg via ORAL
  Filled 2021-05-02 (×7): qty 1

## 2021-05-02 MED ORDER — TAMSULOSIN HCL 0.4 MG PO CAPS
0.4000 mg | ORAL_CAPSULE | Freq: Once | ORAL | Status: DC
  Filled 2021-05-02: qty 1

## 2021-05-02 MED ORDER — QUETIAPINE FUMARATE ER 50 MG PO TB24
50.0000 mg | ORAL_TABLET | Freq: Once | ORAL | Status: AC
  Administered 2021-05-02: 50 mg via ORAL
  Filled 2021-05-02: qty 1

## 2021-05-02 MED ORDER — HYDROCHLOROTHIAZIDE 10 MG/ML ORAL SUSPENSION
6.2500 mg | Freq: Every day | ORAL | Status: DC
  Administered 2021-05-03 – 2021-05-04 (×2): 6.25 mg via ORAL
  Filled 2021-05-02 (×3): qty 1.25

## 2021-05-02 MED ORDER — TRAZODONE HCL 50 MG PO TABS: 50.0000 mg | ORAL_TABLET | Freq: Every day | ORAL | Status: DC

## 2021-05-02 MED ORDER — DULOXETINE HCL 30 MG PO CPEP
30.0000 mg | ORAL_CAPSULE | Freq: Every day | ORAL | Status: DC
  Administered 2021-05-03 – 2021-05-04 (×2): 30 mg via ORAL
  Filled 2021-05-02 (×3): qty 1

## 2021-05-02 MED ORDER — TAMSULOSIN HCL 0.4 MG PO CAPS
0.4000 mg | ORAL_CAPSULE | Freq: Every day | ORAL | Status: DC
  Administered 2021-05-02 – 2021-05-05 (×4): 0.4 mg via ORAL
  Filled 2021-05-02 (×4): qty 1

## 2021-05-02 MED ORDER — DICLOFENAC SODIUM 1 % EX GEL
4.0000 g | Freq: Three times a day (TID) | CUTANEOUS | Status: DC
  Administered 2021-05-03 – 2021-05-05 (×3): 4 g via TOPICAL
  Filled 2021-05-02: qty 100

## 2021-05-02 MED ORDER — IBUPROFEN 800 MG PO TABS
800.0000 mg | ORAL_TABLET | Freq: Three times a day (TID) | ORAL | Status: DC
  Administered 2021-05-02 – 2021-05-04 (×6): 800 mg via ORAL
  Filled 2021-05-02 (×6): qty 1

## 2021-05-02 MED ORDER — QUETIAPINE FUMARATE 200 MG PO TABS
200.0000 mg | ORAL_TABLET | Freq: Every evening | ORAL | Status: DC | PRN
  Administered 2021-05-03 – 2021-05-04 (×2): 200 mg via ORAL
  Filled 2021-05-02 (×3): qty 1

## 2021-05-02 MED ORDER — TRAZODONE HCL 50 MG PO TABS
50.0000 mg | ORAL_TABLET | ORAL | Status: DC
  Administered 2021-05-02: 50 mg via ORAL
  Filled 2021-05-02: qty 1

## 2021-05-02 NOTE — ED Notes (Signed)
Discharge instructions provided and Pt stated understanding. Pt alert, orient and ambulatory prior to d/c from facility. Personal belongings returned from the pink locker. Safe transport called for transportation services to go to [facility]. Pt escorted to the sally port. Safety maintained.

## 2021-05-02 NOTE — Progress Notes (Signed)
Pt in continues obs awaiting transfer to [place]. Safe transport called.

## 2021-05-02 NOTE — Progress Notes (Signed)
Seroquel '50mg'$  administered per Joyce Gross, MD order. Will continue to monitor for SE.

## 2021-05-02 NOTE — ED Provider Notes (Signed)
[facility] EMERGENCY DEPARTMENT Provider Note   CSN: [id number] Arrival date & time: 05/02/21  1424     History Chief Complaint  Patient presents with   Back Pain   Urinary Retention   Post-Traumatic Stress Disorder    Clifford Edwards is a 60 y.o. male.  60 yo M with a chief complaints of difficulty urinating.  This is been going on for a couple days.  Symptoms without urinating for about 3 days now.  Went to the behavioral health urgent care and was that he needs to stay for his mental health unfortunately with this complaint was sent here for evaluation.  Definitely has had some chronic back pain as well.  Has not changed significantly.  No numbness or weakness in the legs no loss of bowel or loss of peripheral sensation.  The history is provided by the patient.  Back Pain Location:  Lumbar spine Quality:  Aching Pain severity:  Moderate Onset quality:  Gradual Duration:  2 months Timing:  Constant Progression:  Unchanged Chronicity:  New Relieved by:  Nothing Worsened by:  Ambulation and bending Ineffective treatments:  None tried Associated symptoms: no abdominal pain, no chest pain, no fever and no headaches       Past Medical History:  Diagnosis Date   Hypertension    Prostate cancer ([place])    PTSD (post-traumatic stress disorder)     There are no problems to display for this patient.   Past Surgical History:  Procedure Laterality Date   BACK SURGERY         No family history on file.  Social History   Tobacco Use   Smoking status: Never   Smokeless tobacco: Never  Substance Use Topics   Alcohol use: Yes   Drug use: Not Currently    Home Medications Prior to Admission medications   Medication Sig Start Date End Date Taking? Authorizing Provider  amLODipine (NORVASC) 10 MG tablet Take 10 mg by mouth daily.    [provider]  Cholecalciferol (VITAMIN D3) 25 MCG (1000 UT) CAPS Take 1,000 Units by mouth daily.     [provider]  DULoxetine (CYMBALTA) 30 MG capsule Take 30 mg by mouth daily with supper.    [provider]  hydrALAZINE (APRESOLINE) 10 MG tablet Take 10 mg by mouth 2 (two) times daily.    [provider]  hydrochlorothiazide (HYDRODIURIL) 12.5 MG tablet Take 6.25 mg by mouth daily.    [provider]  HYDROcodone-acetaminophen (NORCO/VICODIN) 5-325 MG tablet Take 1 tablet by mouth every 4 (four) hours as needed. Patient not taking: Reported on 02/20/2021 09/20/20   Tegeler, Gwenyth Allegra, MD  ibuprofen (ADVIL) 800 MG tablet Take 800 mg by mouth 3 (three) times daily.    [provider]  Omega-3 Fatty Acids (FISH OIL) 1000 MG CAPS Take 1,000 mg by mouth daily.    [provider]  oxyCODONE-acetaminophen (PERCOCET/ROXICET) 5-325 MG tablet Take 1 tablet by mouth every 8 (eight) hours as needed for severe pain or moderate pain.    [provider]  QUEtiapine (SEROQUEL) 400 MG tablet Take 200 mg by mouth See admin instructions. Take 200 mg by mouth at bedtime, if not taking Trazodone    [provider]  tamsulosin (FLOMAX) 0.4 MG CAPS capsule Take 0.4 mg by mouth daily.    [provider]  traZODone (DESYREL) 50 MG tablet Take 50 mg by mouth See admin instructions. Take 50 mg by mouth at bedtime,  if not taking Quetiapine    [provider]  VOLTAREN 1 % GEL Apply 4 g topically See admin instructions. Apply 4 grams to painful sites two times a day and at bedtime    [provider]    Allergies    Penicillins  Review of Systems   Review of Systems  Constitutional:  Negative for chills and fever.  HENT:  Negative for congestion and facial swelling.   Eyes:  Negative for discharge and visual disturbance.  Respiratory:  Negative for shortness of breath.   Cardiovascular:  Negative for chest pain and palpitations.  Gastrointestinal:  Negative for abdominal pain, diarrhea and vomiting.  Genitourinary:   Positive for difficulty urinating.  Musculoskeletal:  Positive for back pain. Negative for arthralgias and myalgias.  Skin:  Negative for color change and rash.  Neurological:  Negative for tremors, syncope and headaches.  Psychiatric/Behavioral:  Negative for confusion and dysphoric mood.    Physical Exam Updated Vital Signs BP (!) 129/93 (BP Location: Left Arm)   Pulse 82   Temp 98 F (36.7 C) (Oral)   Resp 18   SpO2 97%   Physical Exam Vitals and nursing note reviewed.  Constitutional:      Appearance: He is well-developed.  HENT:     Head: Normocephalic and atraumatic.  Eyes:     Pupils: Pupils are equal, round, and reactive to light.  Neck:     Vascular: No JVD.  Cardiovascular:     Rate and Rhythm: Normal rate and regular rhythm.     Heart sounds: No murmur heard.   No friction rub. No gallop.  Pulmonary:     Effort: No respiratory distress.     Breath sounds: No wheezing.  Abdominal:     General: There is no distension.     Tenderness: There is no abdominal tenderness. There is no guarding or rebound.     Comments: No pain in suprapubic region.  Musculoskeletal:        General: Normal range of motion.     Cervical back: Normal range of motion and neck supple.     Comments: Pulse motor and sensation intact in bilateral lower extremities.  Reflexes are 2+ and equal.  No clonus.  Negative straight leg raise test bilaterally.  Skin:    Coloration: Skin is not pale.     Findings: No rash.  Neurological:     Mental Status: He is alert and oriented to person, place, and time.  Psychiatric:        Behavior: Behavior normal.    ED Results / Procedures / Treatments   Labs (all labs ordered are listed, but only abnormal results are displayed) Labs Reviewed  COMPREHENSIVE METABOLIC PANEL - Abnormal; Notable for the following components:      Result Value   Total Protein 6.3 (*)    All other components within normal limits  SALICYLATE LEVEL - Abnormal; Notable for  the following components:   Salicylate Lvl [id number] (*)    All other components within normal limits  ACETAMINOPHEN LEVEL - Abnormal; Notable for the following components:   Acetaminophen (Tylenol), Serum <10 (*)    All other components within normal limits  RESP PANEL BY RT-PCR (FLU A&B, COVID) ARPGX2  CBC WITH DIFFERENTIAL/PLATELET  ETHANOL  RAPID URINE DRUG SCREEN, HOSP PERFORMED  URINALYSIS, ROUTINE W REFLEX MICROSCOPIC    EKG None  Radiology No results found.  Procedures Procedures   Medications Ordered in ED Medications  tamsulosin (FLOMAX) capsule  0.4 mg (has no administration in time range)  amLODipine (NORVASC) tablet 10 mg (10 mg Oral Given 05/02/21 2241)  DULoxetine (CYMBALTA) DR capsule 30 mg (has no administration in time range)  hydrALAZINE (APRESOLINE) tablet 10 mg (10 mg Oral Given 05/02/21 2241)  hydrochlorothiazide (HYDRODIURIL) tablet 6.25 mg (has no administration in time range)  ibuprofen (ADVIL) tablet 800 mg (800 mg Oral Given 05/02/21 2241)  QUEtiapine (SEROQUEL) tablet 200 mg (has no administration in time range)  tamsulosin (FLOMAX) capsule 0.4 mg (0.4 mg Oral Given 05/02/21 2241)  traZODone (DESYREL) tablet 50 mg (50 mg Oral Given 05/02/21 2241)  diclofenac Sodium (VOLTAREN) 1 % topical gel 4 g (has no administration in time range)  ibuprofen (ADVIL) tablet 600 mg (600 mg Oral Given 05/02/21 1805)    ED Course  I have reviewed the triage vital signs and the nursing notes.  Pertinent labs & imaging results that were available during my care of the patient were reviewed by me and considered in my medical decision making (see chart for details).    MDM Rules/Calculators/A&P                           60 yo M with a chief complaints of difficulty urinating.  Going on for 3 days now says he has not urinated.  Seems unlikely as he is otherwise well-appearing no hypotension no tachycardia no palpable bladder.  Bedside ultrasound with less than 9 mL of urine.   Renal function appears to be better than baseline.  He is chronically on tamsulosin but is not been on it for about 3 days, wonder if this is because of his symptoms.  We will restart here.  Seems unlikely to be cauda equina has chronic back pain that is unchanged and is not retaining urine clinically.  Awaiting psych disposition.  I feel he is medically clear.  The patients results and plan were reviewed and discussed.   Any x-rays performed were independently reviewed by myself.   Differential diagnosis were considered with the presenting HPI.  Medications  tamsulosin (FLOMAX) capsule 0.4 mg (has no administration in time range)  amLODipine (NORVASC) tablet 10 mg (10 mg Oral Given 05/02/21 2241)  DULoxetine (CYMBALTA) DR capsule 30 mg (has no administration in time range)  hydrALAZINE (APRESOLINE) tablet 10 mg (10 mg Oral Given 05/02/21 2241)  hydrochlorothiazide (HYDRODIURIL) tablet 6.25 mg (has no administration in time range)  ibuprofen (ADVIL) tablet 800 mg (800 mg Oral Given 05/02/21 2241)  QUEtiapine (SEROQUEL) tablet 200 mg (has no administration in time range)  tamsulosin (FLOMAX) capsule 0.4 mg (0.4 mg Oral Given 05/02/21 2241)  traZODone (DESYREL) tablet 50 mg (50 mg Oral Given 05/02/21 2241)  diclofenac Sodium (VOLTAREN) 1 % topical gel 4 g (has no administration in time range)  ibuprofen (ADVIL) tablet 600 mg (600 mg Oral Given 05/02/21 1805)    Vitals:   05/02/21 1538 05/02/21 2056 05/02/21 2245  BP: (!) 132/104 133/87 (!) 129/93  Pulse: 85 88 82  Resp: '14 18 18  '$ Temp: 98 F (36.7 C)    TempSrc: Oral    SpO2: 96% 98% 97%    Final diagnoses:  Difficulty urinating  Chronic low back pain without sciatica, unspecified back pain laterality  PTSD (post-traumatic stress disorder)      Final Clinical Impression(s) / ED Diagnoses Final diagnoses:  Difficulty urinating  Chronic low back pain without sciatica, unspecified back pain laterality  PTSD (post-traumatic stress  disorder)    Rx / DC Orders ED Discharge Orders     None        Deno Etienne, DO 05/02/21 2249

## 2021-05-02 NOTE — ED Notes (Signed)
Unsuccessful at collecting blood sample x2 sticks, Pt tolerated well. Pt stated that he has not urinated since last Thursday. No urine sample was collected either. Meal provided to Pt after being brought onto the observation unit. 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 Safety maintained and will continue to monitor.

## 2021-05-02 NOTE — ED Provider Notes (Signed)
Behavioral Health Urgent Care Medical Screening Exam  Patient Name: Clifford Edwards MRN: [id number] Date of Evaluation: 05/02/21 Chief Complaint:  My PTSD is really bad and I want to kill this man who did not pay me for my job. I am out of my meds. Diagnosis:  Final diagnoses:  PTSD (post-traumatic stress disorder)    History of Present illness: Clifford Edwards is a 60 y.o. male w/ hx of PTSD and prostate cancer.   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 Patient reports that he returned from [facility] for this job and has been living a car, that the man offered for him to live in while he has completed the work. Patient reports that since the man has refused to pay what he considered acceptable (more than $20) he has become homicidal. Patient reports that he wants to use his .323 to shoot the man and reports he is upset because part of his "safety plan" is that his one brother in [place] has his guns locked away. Patient also reports he has called all 9 of his brothers as this was also part of his "safety plan" put in place by his psychiatrist at [facility]; however patient reports he continues to feel HI and is afraid he will act on his HI very soon with or without his guns. Patient also reports that because he has no money he cannot afford to get to the [facility] and has been unable to get refills of his medications. Patient endorses he has ben out of his Seroquel, tamsulosin, Cymbalta and other medications for 2 weeks. Patient reports that he has not had real sleep in 5 days and when he does sleep his nightmares have returned to his nightmares about the battlefields. Patient reports that he is now having flashbacks to his combat zones and seeing people die and he is more easily stimulated by smells and loud noises. Patient reports he was in the [employer] for 22 years and served in multiple combat zones ( [place], [place], [place]  are the ones he mentioned) and at one point was one of 2 survivors in a group. Patient reports that his PTSD resurgence in acuity of symptoms has also led to him having more myalgias and joint pains at areas of his body that were wounded during these missions.  Medically patient endorses that he has been told by his PCP at the [place] that there is concern that he may have metastasis and there is concern for lung cancer and patient is also scheduled for a colonoscopy on 8/18. Patient reported to provider on assessment today that he is constipated having some watery stools and has not urinated in 4 days.  Patient denies SI and AVH.  Sx Patient has 1 brother in [place] and the other 8 and most of his family are in [place]. Patient is close with his family.   Psychiatric Specialty Exam  Presentation  General Appearance:Appropriate for Environment  Eye Contact:Good  Speech:Clear and Coherent; Pressured  Speech Volume:Increased  Handedness:No data recorded  Mood and Affect  Mood:Angry (but redirectable)  Affect:Congruent   Thought Process  Thought Processes:Coherent  Descriptions of Associations:Intact  Orientation:Full (Time, Place and Person)  Thought Content:Logical  Diagnosis of Schizophrenia or Schizoaffective disorder in past: No  Duration of Psychotic Symptoms: Less than six months  Hallucinations:None  Ideas of Reference:None  Suicidal Thoughts:No  Homicidal Thoughts:Yes, Active With Plan; Without Means  to [employer]; With Intent   Sensorium  Memory:Immediate Good; Recent Good; Remote Good  Judgment:Fair  Insight:Good   Executive Functions  Concentration:Fair  Attention Span:Good  [place]  Language:Good   Psychomotor Activity  Psychomotor Activity:Increased   Assets  Assets:Communication Skills; Desire for Improvement; Financial Resources/Insurance; Social Support; Resilience   Sleep  Sleep:Poor  Number of hours:  No  data recorded  No data recorded  Physical Exam: Physical Exam Constitutional:      Appearance: Normal appearance.  HENT:     Head: Normocephalic and atraumatic.     Nose: Nose normal.  Eyes:     Extraocular Movements: Extraocular movements intact.     Pupils: Pupils are equal, round, and reactive to light.  Cardiovascular:     Rate and Rhythm: Normal rate.     Pulses: Normal pulses.  Pulmonary:     Effort: Pulmonary effort is normal.  Musculoskeletal:        General: Normal range of motion.  Skin:    General: Skin is warm and dry.  Neurological:     General: No focal deficit present.     Mental Status: He is alert.   Review of Systems  Constitutional:  Positive for malaise/fatigue. Negative for chills and fever.  HENT:  Negative for hearing loss.   Eyes:  Negative for blurred vision.  Respiratory:  Negative for cough and wheezing.   Cardiovascular:  Negative for chest pain.  Gastrointestinal:  Negative for abdominal pain.  Musculoskeletal:  Positive for joint pain and myalgias.  Neurological:  Negative for dizziness.  Psychiatric/Behavioral:  Positive for depression. Negative for suicidal ideas. The patient is nervous/anxious and has insomnia.   Blood pressure (!) 138/105, pulse 79, temperature 98 F (36.7 C), temperature source Oral, resp. rate 16, SpO2 100 %. There is no height or weight on file to calculate BMI.  Musculoskeletal: Strength & Muscle Tone: within normal limits Gait & Station: normal Patient leans: N/A   [facility] MSE Discharge Disposition for Follow up and Recommendations: Based on my evaluation the patient appears to have an emergency medical condition for which I recommend the patient be transferred to the emergency department for further evaluation.  PTSD Concern for urinary retention On assessment patient endorses PTSD worsened by recent stressors and cessation of medication due to inability to pick up his refill. Patient also endorses urinary  retention x4 days and has a hx of cancer and reports concern from his PCP for mets. Unfortunately patient's records are not available as he is seen at the [place] in [place]. However, patient's current presentation is concerning and patient needs medical clearance before he could go inpatient to a psych facility.  Recommendations  - Patient received '50mg'$  Seroquel to treat his agitation at [facility]; however as patient finds the medication overly sedating did not restart at full dose due to pending transfer. - Recommend patient be restarted on his Seroquel '200mg'$  QHS - recommend patient be restarted on his Cymbalta '30mg'$  daily - Medical mgmt per EDP, pending transfer to [facility] for urinary retention and medical clearance   PGY-2 Freida Busman, MD 05/02/2021, 12:41 PM
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
## 2021-05-02 NOTE — ED Notes (Signed)
0-9 mL detected on multiple attempts at bladder scanning.

## 2021-05-02 NOTE — Progress Notes (Signed)
Called [facility] to give nurse to nurse report to [facility] CN. Call was not answered. VM is not set up. Phone number called [phone] and was re-routed to [phone]. Will try again later.
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
## 2021-05-02 NOTE — ED Provider Notes (Signed)
Emergency Medicine Provider Triage Evaluation Note  Clifford Edwards , a 60 y.o. male  was evaluated in triage.  Pt here from [place] and sent here for med eval. Per note it seems that pt is recommended for inpt however he told staff he has not urinated in 4 days. He was sent here for med clearance  Review of Systems  Positive: Hi, avh, urinary problem Negative: Si   Physical Exam  BP (!) 132/104 (BP Location: Left Arm)   Pulse 85   Temp 98 F (36.7 C) (Oral)   Resp 14   SpO2 96%  Gen:   Awake, no distress   Resp:  Normal effort  MSK:   Moves extremities without difficulty  Other:  Abd soft and nontender cooperative  Medical Decision Making  Medically screening exam initiated at 5:58 PM.  Appropriate orders placed.  Clifford Edwards was informed that the remainder of the evaluation will be completed by another provider, this initial triage assessment does not replace that evaluation, and the importance of remaining in the ED until their evaluation is complete.     Rodney Booze, PA-C 05/02/21 [place], [place], DO 05/02/21 1946

## 2021-05-03 ENCOUNTER — Encounter (HOSPITAL_COMMUNITY): Payer: Self-pay | Admitting: Registered Nurse

## 2021-05-03 DIAGNOSIS — R45850 Homicidal ideations: Secondary | ICD-10-CM

## 2021-05-03 DIAGNOSIS — F332 Major depressive disorder, recurrent severe without psychotic features: Secondary | ICD-10-CM

## 2021-05-03 DIAGNOSIS — F4310 Post-traumatic stress disorder, unspecified: Secondary | ICD-10-CM | POA: Diagnosis present

## 2021-05-03 NOTE — Progress Notes (Signed)
CSW confirmed no bed availability at [place] at this time. Additionally, there is currently no AC at [facility] to review patient information for admission to [facility]. Accordingly, CSW referred patient to the following facilities. Patient is recommended inpatient per Earleen Newport, NP.   Destination Service Provider Address Phone [facility]  [address] [place] [id number] [place]  [facility]  [address] [place] [address] [phone]  [facility]  [address] [place] [address] [phone]  [place] [address], [place] [address] [id number]  [facility]  [place] [address] [phone]  [facility]  [address] [place] [address] [phone]  [facility]  [address] [place] [address] [phone]  [facility]  [address] [place] [address] [id number] [phone]    CSW will continue to monitor disposition.   Signed:  Durenda Hurt, MSW, [place], LCASA 05/03/2021 9:12 PM

## 2021-05-03 NOTE — Progress Notes (Signed)
CSW followed up with [place] and [facility] who both report they have no availability at this time. [facility] reports they have a scheduled discharge for the morning, though they are unsure if anyone will present to their ED needing that bed.   Signed:  Durenda Hurt, MSW, [place], LCASA 05/03/2021 8:55 PM

## 2021-05-03 NOTE — Progress Notes (Signed)
CSW contacted multiple [facility] in both [place] and [place]. According to [facility] the only [place] with availability is [place] and [place]. CSW contacted both [place] locations and was not able to speak to anyone after multiple attempts. CSW contacted [facility] and was unsuccessful with speaking with anyone there. CSW will inform 2nd shift disposition Education officer, museum for follow up.   Mariea Clonts, MSW, LCSW-A  2:54 PM 05/03/2021

## 2021-05-03 NOTE — ED Notes (Signed)
Belonging inventoried, and placed in locker 1

## 2021-05-03 NOTE — Consult Note (Signed)
Telepsych Consultation   Reason for Consult:  Depression, homicidal ideation Referring Physician:  Bishop Dublin Location of Patient: [facility] Location of Provider: Other: [facility]  Patient Identification: Clifford Edwards MRN:  [id number] Principal Diagnosis: MDD (major depressive disorder), recurrent severe, without psychosis ([place]) Diagnosis:  Principal Problem:   MDD (major depressive disorder), recurrent severe, without psychosis ([place]) Active Problems:   PTSD (post-traumatic stress disorder)   Homicidal ideation   Total Time spent with patient: 30 minutes  Subjective:   Clifford Edwards is a 60 y.o. male patient admitted to [facility] ED after being sent from [facility] where he initially presented with complaints of depression, suicidal/homicidal ideation, and homelessness.  Per Dr. 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 Patient reports that he returned from [facility] for this job and has been living a car, that the man offered for him to live in while he has completed the work. Patient reports that since the man has refused to pay what he considered acceptable (more than $20) he has become homicidal. Patient reports that he wants to use his .323 to shoot the man and reports he is upset because part of his "safety plan" is that his one brother in [place] has his guns locked away. Patient also reports he has called all 9 of his brothers as this was also part of his "safety plan" put in place by his psychiatrist at [facility]; however patient reports he continues to feel HI and is afraid he will act on his HI very soon with or without his guns. Patient also reports that because he has no money he cannot afford to get to the [facility] and has been unable to get refills of his medications. Patient endorses he has ben out of his Seroquel, tamsulosin, Cymbalta and other medications for 2 weeks.  Patient reports that he has not had real sleep in 5 days and when he does sleep his nightmares have returned to his nightmares about the battlefields. Patient reports that he is now having flashbacks to his combat zones and seeing people die and he is more easily stimulated by smells and loud noises. Patient reports he was in the [employer] for 22 years and served in multiple combat zones ( [place], [place], [place] are the ones he mentioned) and at one point was one of 2 survivors in a group. Patient reports that his PTSD resurgence in acuity of symptoms has also led to him having more myalgias and joint pains at areas of his body that were wounded during these missions.  Medically patient endorses that he has been told by his PCP at the [place] that there is concern that he may have metastasis and there is concern for lung cancer and patient is also scheduled for a colonoscopy on 8/18. Patient reported to provider on assessment today that he is constipated having some watery stools and has not urinated in 4 days.  Patient denies SI and AVH.  HPI:  Clifford Edwards, 60 y.o., male patient seen via tele health by this provider, consulted with Dr. Hampton Abbot; and chart reviewed on 05/03/21.  On evaluation Clifford Edwards reports he continues to be homicidal and suicidal.  No specific plans but talks about being a combat veteran and how he is now treated and doesn't feel he should be treated that way.  Angry related to do work for  someone and now not wanting to pay him.  Patient denies paranoia and psychosis.  He is unable to contract for safety.   During evaluation Clifford Edwards is sitting on side of bed in no acute distress.  He is alert, oriented x 4, calm and cooperative.  His mood is dysphoric, anxious, and irritable with congruent affect.  He does not appear to be responding to internal/external stimuli or delusional thoughts.  Patient denies psychosis, and paranoia; but continues to endorse suicidal and homicidal  ideation.  Patient answered question appropriately.   Past Psychiatric History: PTSD, MDD   Past Medical History:  Past Medical History:  Diagnosis Date   Hypertension    Prostate cancer ([place])    PTSD (post-traumatic stress disorder)     Past Surgical History:  Procedure Laterality Date   BACK SURGERY     Family History: History reviewed. No pertinent family history. Family Psychiatric  History: None noted Social History:  Social History   Substance and Sexual Activity  Alcohol Use Yes     Social History   Substance and Sexual Activity  Drug Use Not Currently    Social History   Socioeconomic History   Marital status: Single    Spouse name: Not on file   Number of children: Not on file   Years of education: Not on file   Highest education level: Not on file  Occupational History   Not on file  Tobacco Use   Smoking status: Never   Smokeless tobacco: Never  Substance and Sexual Activity   Alcohol use: Yes   Drug use: Not Currently   Sexual activity: Not on file  Other Topics Concern   Not on file  Social History Narrative   Not on file   Social Determinants of Health   Financial Resource Strain: Not on file  Food Insecurity: Not on file  Transportation Needs: Not on file  Physical Activity: Not on file  Stress: Not on file  Social Connections: Not on file   Additional Social History:    Allergies:   Allergies  Allergen Reactions   Penicillins Hives and Other (See Comments)    SEVERE, LARGE HIVES/WEALS    Labs:  Results for orders placed or performed during the hospital encounter of 05/02/21 (from the past 48 hour(s))  Comprehensive metabolic panel     Status: Abnormal   Collection Time: 05/02/21  4:22 PM  Result Value Ref Range   Sodium 137 135 - 145 mmol/L   Potassium 3.8 3.5 - 5.1 mmol/L   Chloride 103 98 - 111 mmol/L   CO2 26 22 - 32 mmol/L   Glucose, Bld 92 70 - 99 mg/dL    Comment: Glucose reference range applies only to samples  taken after fasting for at least 8 hours.   BUN 13 6 - 20 mg/dL   Creatinine, Ser 0.99 0.61 - 1.24 mg/dL   Calcium 9.8 8.9 - 10.3 mg/dL   Total Protein 6.3 (L) 6.5 - 8.1 g/dL   Albumin 3.9 3.5 - 5.0 g/dL   AST 29 15 - 41 U/L   ALT 20 0 - 44 U/L   Alkaline Phosphatase 44 38 - 126 U/L   Total Bilirubin 0.5 0.3 - 1.2 mg/dL   GFR, Estimated >60 >60 mL/min    Comment: (NOTE) Calculated using the CKD-EPI Creatinine Equation (2021)    Anion gap 8 5 - 15    Comment: Performed at [facility] [address], [place], [place]  [address]  CBC with Differential     Status: None   Collection Time: 05/02/21  4:22 PM  Result Value Ref Range   WBC 5.4 4.0 - 10.5 K/uL   RBC 4.68 4.22 - 5.81 MIL/uL   Hemoglobin 14.2 13.0 - 17.0 g/dL   HCT 42.0 39.0 - 52.0 %   MCV 89.7 80.0 - 100.0 fL   MCH 30.3 26.0 - 34.0 pg   MCHC 33.8 30.0 - 36.0 g/dL   RDW 14.0 11.5 - 15.5 %   Platelets 279 150 - 400 K/uL   nRBC 0.0 0.0 - 0.2 %   Neutrophils Relative % 45 %   Neutro Abs 2.5 1.7 - 7.7 K/uL   Lymphocytes Relative 45 %   Lymphs Abs 2.4 0.7 - 4.0 K/uL   Monocytes Relative 7 %   Monocytes Absolute 0.4 0.1 - 1.0 K/uL   Eosinophils Relative 2 %   Eosinophils Absolute 0.1 0.0 - 0.5 K/uL   Basophils Relative 1 %   Basophils Absolute 0.0 0.0 - 0.1 K/uL   Immature Granulocytes 0 %   Abs Immature Granulocytes 0.01 0.00 - 0.07 K/uL    Comment: Performed at [facility], [address], [place], [place] [address]  Ethanol     Status: None   Collection Time: 05/02/21  4:22 PM  Result Value Ref Range   Alcohol, Ethyl (B) <10 <10 mg/dL    Comment: (NOTE) Lowest detectable limit for serum alcohol is 10 mg/dL.  For medical purposes only. Performed at [facility], [place] [address], [place], [place] [id number]   Salicylate level     Status: Abnormal   Collection Time: 05/02/21  4:22 PM  Result Value Ref Range   Salicylate Lvl [id number] (L) 7.0 - 30.0 mg/dL    Comment: Performed at [facility] [address], [place], [place] [address]  Acetaminophen level     Status: Abnormal   Collection Time: 05/02/21  4:22 PM  Result Value Ref Range   Acetaminophen (Tylenol), Serum <10 (L) 10 - 30 ug/mL    Comment: (NOTE) Therapeutic concentrations vary significantly. A range of 10-30 ug/mL  may be an effective concentration for many patients. However, some  are best treated at concentrations outside of this range. Acetaminophen concentrations >150 ug/mL at 4 hours after ingestion  and >50 ug/mL at 12 hours after ingestion are often associated with  toxic reactions.  Performed at [facility], [place] [address] [place], [place] [address]     Medications:  Current Facility-Administered Medications  Medication Dose Route Frequency Provider Last Rate Last Admin   amLODipine (NORVASC) tablet 10 mg  10 mg Oral Daily Deno Etienne, DO   10 mg at 05/03/21 [id number]   diclofenac Sodium (VOLTAREN) 1 % topical gel 4 g  4 g Topical TID Deno Etienne, DO   4 g at 05/03/21 1038   DULoxetine (CYMBALTA) DR capsule 30 mg  30 mg Oral Q supper Deno Etienne, DO       hydrALAZINE (APRESOLINE) tablet 10 mg  10 mg Oral BID Deno Etienne, DO   10 mg at 05/03/21 [id number]   hydrochlorothiazide 10 mg/mL oral suspension 6.25 mg  6.25 mg Oral Daily Deno Etienne, DO       ibuprofen (ADVIL) tablet 800 mg  800 mg Oral TID Deno Etienne, DO   800 mg at 05/03/21 0955   QUEtiapine (SEROQUEL) tablet 200 mg  200 mg Oral QHS PRN Deno Etienne,  DO       tamsulosin (FLOMAX) capsule 0.4 mg  0.4 mg Oral Once Deno Etienne, DO       tamsulosin [facility]) capsule 0.4 mg  0.4 mg Oral Daily Deno Etienne, DO   0.4 mg at 05/03/21 0955   traZODone (DESYREL) tablet 50 mg  50 mg Oral QHS Deno Etienne, DO       Current Outpatient Medications  Medication Sig Dispense Refill   amLODipine (NORVASC) 10 MG tablet Take 10 mg by mouth daily.     Cholecalciferol (VITAMIN D3) 25 MCG (1000 UT) CAPS Take 1,000 Units by mouth daily.     DULoxetine (CYMBALTA) 30 MG capsule Take  30 mg by mouth daily with supper.     hydrALAZINE (APRESOLINE) 10 MG tablet Take 10 mg by mouth 2 (two) times daily.     hydrochlorothiazide (HYDRODIURIL) 12.5 MG tablet Take 6.25 mg by mouth daily.     ibuprofen (ADVIL) 800 MG tablet Take 800 mg by mouth 3 (three) times daily.     Omega-3 Fatty Acids (FISH OIL) 1000 MG CAPS Take 1,000 mg by mouth daily.     oxyCODONE-acetaminophen (PERCOCET/ROXICET) 5-325 MG tablet Take 1 tablet by mouth every 8 (eight) hours as needed for severe pain or moderate pain.     QUEtiapine (SEROQUEL) 400 MG tablet Take 200 mg by mouth See admin instructions. Take 200 mg by mouth at bedtime, if not taking Trazodone     tamsulosin (FLOMAX) 0.4 MG CAPS capsule Take 0.4 mg by mouth daily.     VOLTAREN 1 % GEL Apply 4 g topically See admin instructions. Apply 4 grams to painful sites two times a day and at bedtime     HYDROcodone-acetaminophen (NORCO/VICODIN) 5-325 MG tablet Take 1 tablet by mouth every 4 (four) hours as needed. (Patient not taking: No sig reported) 10 tablet 0   traZODone (DESYREL) 50 MG tablet Take 50 mg by mouth See admin instructions. Take 50 mg by mouth at bedtime, if not taking Quetiapine (Patient not taking: No sig reported)      Musculoskeletal: Strength & Muscle Tone: within normal limits Gait & Station: normal Patient leans: N/A   Psychiatric Specialty Exam:  Presentation  General Appearance: Appropriate for Environment  Eye Contact:Good  Speech:Clear and Coherent; Normal Rate  Speech Volume:Normal  Handedness:Right   Mood and Affect  Mood:Depressed; Angry  Affect:Congruent   Thought Process  Thought Processes:Coherent; Goal Directed  Descriptions of Associations:Intact  Orientation:Full (Time, Place and Person)  Thought Content:WDL  History of Schizophrenia/Schizoaffective disorder:No  Duration of Psychotic Symptoms:Less than six months  Hallucinations:Hallucinations: None  Ideas of Reference:None  Suicidal  Thoughts:Suicidal Thoughts: Yes, Passive SI Passive Intent and/or Plan: Without Intent; Without Plan  Homicidal Thoughts:Homicidal Thoughts: Yes, Passive ("I'm still angry and thoughts of hurting him") HI Active Intent and/or Plan: Without Intent   Sensorium  Memory:Immediate Good; Recent Good  Judgment:Fair  Insight:Fair; Present   Executive Functions  Concentration:Good  Attention Span:Good  [place] of Knowledge:Good  Language:Good   Psychomotor Activity  Psychomotor Activity:Psychomotor Activity: Normal   Assets  Assets:Desire for Improvement; Armed forces logistics/support/administrative officer; Resilience   Sleep  Sleep:Sleep: Fair    Physical Exam: Physical Exam ROS Blood pressure 116/74, pulse 84, temperature 97.8 F (36.6 C), temperature source Oral, resp. rate 16, SpO2 100 %. There is no height or weight on file to calculate BMI.  Treatment Plan Summary: Daily contact with patient to assess and evaluate symptoms and progress in treatment, Medication management,  and Plan Inpatient psychiatric treatment  Disposition: Recommend psychiatric Inpatient admission when medically cleared.  This service was provided via telemedicine using a 2-way, interactive audio and video technology.  Names of all persons participating in this telemedicine service and their role in this encounter. Name: Earleen Newport Role: NP  Name: Dr. Hampton Abbot Role: Psychiatrist  Name: Clifford Edwards Role: Patient  Name: [place] Nickerson, RN Role: Patient's nurse sent secure message informing:  Psychiatric consult complete; recommend for inpatient psychiatric treatment    Kaylan Yates, NP 05/03/2021 1:35 PM

## 2021-05-04 LAB — URINALYSIS, ROUTINE W REFLEX MICROSCOPIC
Bilirubin Urine: NEGATIVE
Glucose, UA: NEGATIVE mg/dL
Hgb urine dipstick: NEGATIVE
Ketones, ur: NEGATIVE mg/dL
Leukocytes,Ua: NEGATIVE
Nitrite: NEGATIVE
Protein, ur: NEGATIVE mg/dL
Specific Gravity, Urine: 1.018 (ref 1.005–1.030)
pH: 5 (ref 5.0–8.0)

## 2021-05-04 LAB — RAPID URINE DRUG SCREEN, HOSP PERFORMED
Amphetamines: NOT DETECTED
Barbiturates: NOT DETECTED
Benzodiazepines: NOT DETECTED
Cocaine: POSITIVE — AB
Opiates: NOT DETECTED
Tetrahydrocannabinol: POSITIVE — AB

## 2021-05-04 MED ORDER — ACETAMINOPHEN 325 MG PO TABS
650.0000 mg | ORAL_TABLET | Freq: Four times a day (QID) | ORAL | Status: DC
  Administered 2021-05-04 (×2): 650 mg via ORAL
  Filled 2021-05-04 (×3): qty 2

## 2021-05-04 MED ORDER — METHOCARBAMOL 500 MG PO TABS
500.0000 mg | ORAL_TABLET | Freq: Three times a day (TID) | ORAL | Status: DC | PRN
  Administered 2021-05-04 (×2): 500 mg via ORAL
  Filled 2021-05-04 (×3): qty 1

## 2021-05-04 NOTE — ED Notes (Signed)
IVC paper work is done. Handed the papers to the nurse

## 2021-05-04 NOTE — Consult Note (Signed)
Telepsych Consultation   Reason for Consult:  Depression, homicidal ideation Referring Physician:  Bishop Dublin Location of Patient: [facility] ED Location of Provider: Other: [facility]  Patient Identification: Clifford Edwards MRN:  [id number] Principal Diagnosis: MDD (major depressive disorder), recurrent severe, without psychosis ([place]) Diagnosis:  Principal Problem:   MDD (major depressive disorder), recurrent severe, without psychosis ([place]) Active Problems:   PTSD (post-traumatic stress disorder)   Homicidal ideation   Total Time spent with patient: 30 minutes  Subjective:   Clifford Edwards is a 60 y.o. male patient admitted to [facility] ED after being sent from [facility] where he initially presented with complaints of depression, suicidal/homicidal ideation, and homelessness.  Psychiatric Consult Reassessment 05/04/2021 Clifford Edwards, 60 y.o., male patient seen via tele health by this provider, consulted with Dr. Hampton Abbot; and chart reviewed on 05/04/21.  On evaluation Clifford Edwards reports he feels "okay" just in a lot of pain from his back.  Patient states he continues to have suicidal and homicidal thoughts.  "I have an issue I need to take care of.  I need to handle.  I haven't felt like this since I got out (referring to [employer]).  I'm not gone let nobody take nothing from me.  I fought to hard over there an now over here the same thing is happening; I ain't gone let it happen."  Patient asked what was taken away from him "Money, when you done with a job; you supposed to be paid for the work you done, not add more stuff on so won't have to pay.  You pay up and then negotiate."  Patient gave the name of person he was working for but difficult to understand (middle [place] name was stated).   During evaluation Clifford Edwards is elevated up in bed in no acute distress.  He is alert, oriented x 4.  He is calm and cooperative throughout assessment.  His mood is dysphoric and anxious, congruent affect.   He does not appear to be responding to internal/external stimuli or delusional thoughts; and he denies psychosis and paranoia; but continues to endorse suicidal and homicidal ideation with intent; no plan given.  Patient answered question appropriately.   Psychiatric Consult 05/03/2021 HPI:  Clifford Edwards, 60 y.o., male patient seen via tele health by this provider, consulted with Dr. Hampton Abbot; and chart reviewed on 05/04/21.  On evaluation Clifford Edwards reports he continues to be homicidal and suicidal.  No specific plans but talks about being a combat veteran and how he is now treated and doesn't feel he should be treated that way.  Angry related to do work for someone and now not wanting to pay him.  Patient denies paranoia and psychosis.  He is unable to contract for safety.   During evaluation Clifford Edwards is sitting on side of bed in no acute distress.  He is alert, oriented x 4, calm and cooperative.  His mood is dysphoric, anxious, and irritable with congruent affect.  He does not appear to be responding to internal/external stimuli or delusional thoughts.  Patient denies psychosis, and paranoia; but continues to endorse suicidal and homicidal ideation.  Patient answered question appropriately.  Per Dr. 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 Patient reports that he returned from [facility] for this job and has been living a car, that the man offered for him to live  in while he has completed the work. Patient reports that since the man has refused to pay what he considered acceptable (more than $20) he has become homicidal. Patient reports that he wants to use his .323 to shoot the man and reports he is upset because part of his "safety plan" is that his one brother in [place] has his guns locked away. Patient also reports he has called all 9 of his brothers as this was also part of his  "safety plan" put in place by his psychiatrist at [facility]; however patient reports he continues to feel HI and is afraid he will act on his HI very soon with or without his guns. Patient also reports that because he has no money he cannot afford to get to the [facility] and has been unable to get refills of his medications. Patient endorses he has ben out of his Seroquel, tamsulosin, Cymbalta and other medications for 2 weeks. Patient reports that he has not had real sleep in 5 days and when he does sleep his nightmares have returned to his nightmares about the battlefields. Patient reports that he is now having flashbacks to his combat zones and seeing people die and he is more easily stimulated by smells and loud noises. Patient reports he was in the [employer] for 22 years and served in multiple combat zones ( [place], [place], [place] are the ones he mentioned) and at one point was one of 2 survivors in a group. Patient reports that his PTSD resurgence in acuity of symptoms has also led to him having more myalgias and joint pains at areas of his body that were wounded during these missions.  Medically patient endorses that he has been told by his PCP at the [place] that there is concern that he may have metastasis and there is concern for lung cancer and patient is also scheduled for a colonoscopy on 8/18. Patient reported to provider on assessment today that he is constipated having some watery stools and has not urinated in 4 days.  Patient denies SI and AVH.  Past Psychiatric History: PTSD, MDD   Past Medical History:  Past Medical History:  Diagnosis Date  . Hypertension   . Prostate cancer ([place])   . PTSD (post-traumatic stress disorder)     Past Surgical History:  Procedure Laterality Date  . BACK SURGERY     Family History: History reviewed. No pertinent family history. Family Psychiatric  History: None noted Social History:  Social History   Substance and Sexual Activity  Alcohol Use Yes      Social History   Substance and Sexual Activity  Drug Use Not Currently    Social History   Socioeconomic History  . Marital status: Single    Spouse name: Not on file  . Number of children: Not on file  . Years of education: Not on file  . Highest education level: Not on file  Occupational History  . Not on file  Tobacco Use  . Smoking status: Never  . Smokeless tobacco: Never  Substance and Sexual Activity  . Alcohol use: Yes  . Drug use: Not Currently  . Sexual activity: Not on file  Other Topics Concern  . Not on file  Social History Narrative  . Not on file   Social Determinants of Health   Financial Resource Strain: Not on file  Food Insecurity: Not on file  Transportation Needs: Not on file  Physical Activity: Not on file  Stress: Not on file  Social Connections: Not on file   Additional Social History:    Allergies:   Allergies  Allergen Reactions  . Penicillins Hives and Other (See Comments)    SEVERE, LARGE HIVES/WEALS    Labs:  Results for orders placed or performed during the hospital encounter of 05/02/21 (from the past 48 hour(s))  Comprehensive metabolic panel     Status: Abnormal   Collection Time: 05/02/21  4:22 PM  Result Value Ref Range   Sodium 137 135 - 145 mmol/L   Potassium 3.8 3.5 - 5.1 mmol/L   Chloride 103 98 - 111 mmol/L   CO2 26 22 - 32 mmol/L   Glucose, Bld 92 70 - 99 mg/dL    Comment: Glucose reference range applies only to samples taken after fasting for at least 8 hours.   BUN 13 6 - 20 mg/dL   Creatinine, Ser 0.99 0.61 - 1.24 mg/dL   Calcium 9.8 8.9 - 10.3 mg/dL   Total Protein 6.3 (L) 6.5 - 8.1 g/dL   Albumin 3.9 3.5 - 5.0 g/dL   AST 29 15 - 41 U/L   ALT 20 0 - 44 U/L   Alkaline Phosphatase 44 38 - 126 U/L   Total Bilirubin 0.5 0.3 - 1.2 mg/dL   GFR, Estimated >60 >60 mL/min    Comment: (NOTE) Calculated using the CKD-EPI Creatinine Equation (2021)    Anion gap 8 5 - 15    Comment: Performed at [facility] [address], [place], [place] [address]  CBC with Differential     Status: None   Collection Time: 05/02/21  4:22 PM  Result Value Ref Range   WBC 5.4 4.0 - 10.5 K/uL   RBC 4.68 4.22 - 5.81 MIL/uL   Hemoglobin 14.2 13.0 - 17.0 g/dL   HCT 42.0 39.0 - 52.0 %   MCV 89.7 80.0 - 100.0 fL   MCH 30.3 26.0 - 34.0 pg   MCHC 33.8 30.0 - 36.0 g/dL   RDW 14.0 11.5 - 15.5 %   Platelets 279 150 - 400 K/uL   nRBC 0.0 0.0 - 0.2 %   Neutrophils Relative % 45 %   Neutro Abs 2.5 1.7 - 7.7 K/uL   Lymphocytes Relative 45 %   Lymphs Abs 2.4 0.7 - 4.0 K/uL   Monocytes Relative 7 %   Monocytes Absolute 0.4 0.1 - 1.0 K/uL   Eosinophils Relative 2 %   Eosinophils Absolute 0.1 0.0 - 0.5 K/uL   Basophils Relative 1 %   Basophils Absolute 0.0 0.0 - 0.1 K/uL   Immature Granulocytes 0 %   Abs Immature Granulocytes 0.01 0.00 - 0.07 K/uL    Comment: Performed at [facility], [address] [place], [place] [address]  Ethanol     Status: None   Collection Time: 05/02/21  4:22 PM  Result Value Ref Range   Alcohol, Ethyl (B) <10 <10 mg/dL    Comment: (NOTE) Lowest detectable limit for serum alcohol is 10 mg/dL.  For medical purposes only. Performed at [facility], [place] [address], [place], [place] [id number]   Salicylate level     Status: Abnormal   Collection Time: 05/02/21  4:22 PM  Result Value Ref Range   Salicylate Lvl [id number] (L) 7.0 - 30.0 mg/dL    Comment: Performed at [facility] [address] [place], [place] [address]  Acetaminophen level     Status: Abnormal   Collection Time: 05/02/21  4:22 PM  Result Value Ref Range   Acetaminophen (Tylenol), Serum <10 (L) 10 - 30 ug/mL    Comment: (NOTE) Therapeutic concentrations vary significantly. A range of 10-30 ug/mL  may be an effective concentration for many patients. However, some  are best treated at concentrations outside of this range. Acetaminophen concentrations >150 ug/mL at 4 hours after ingestion  and  >50 ug/mL at 12 hours after ingestion are often associated with  toxic reactions.  Performed at [facility], [place] [address] [place], [place] [address]   Urine rapid drug screen (hosp performed)     Status: Abnormal   Collection Time: 05/04/21  8:21 AM  Result Value Ref Range   Opiates NONE DETECTED NONE DETECTED   Cocaine POSITIVE (A) NONE DETECTED   Benzodiazepines NONE DETECTED NONE DETECTED   Amphetamines NONE DETECTED NONE DETECTED   Tetrahydrocannabinol POSITIVE (A) NONE DETECTED   Barbiturates NONE DETECTED NONE DETECTED    Comment: (NOTE) DRUG SCREEN FOR MEDICAL PURPOSES ONLY.  IF CONFIRMATION IS NEEDED FOR ANY PURPOSE, NOTIFY LAB WITHIN 5 DAYS.  LOWEST DETECTABLE LIMITS FOR URINE DRUG SCREEN Drug Class                     Cutoff (ng/mL) Amphetamine and metabolites    1000 Barbiturate and metabolites    200 Benzodiazepine                 [id number] Tricyclics and metabolites     300 Opiates and metabolites        300 Cocaine and metabolites        300 THC                            50 Performed at [facility], [place] [address], [place], [place] [address]   Urinalysis, Routine w reflex microscopic Urine, Unspecified Source     Status: None   Collection Time: 05/04/21  8:21 AM  Result Value Ref Range   Color, Urine YELLOW YELLOW   APPearance CLEAR CLEAR   Specific Gravity, Urine 1.018 1.005 - 1.030   pH 5.0 5.0 - 8.0   Glucose, UA NEGATIVE NEGATIVE mg/dL   Hgb urine dipstick NEGATIVE NEGATIVE   Bilirubin Urine NEGATIVE NEGATIVE   Ketones, ur NEGATIVE NEGATIVE mg/dL   Protein, ur NEGATIVE NEGATIVE mg/dL   Nitrite NEGATIVE NEGATIVE   Leukocytes,Ua NEGATIVE NEGATIVE    Comment: Performed at [facility] [address], [place], [place] [address]    Medications:  Current Facility-Administered Medications  Medication Dose Route Frequency Provider Last Rate Last Admin  . acetaminophen (TYLENOL) tablet 650 mg  650 mg Oral Q6H F[place], Wylder S, PA   650  mg at 05/04/21 1044  . amLODipine (NORVASC) tablet 10 mg  10 mg Oral Daily Deno Etienne, DO   10 mg at 05/04/21 1044  . diclofenac Sodium (VOLTAREN) 1 % topical gel 4 g  4 g Topical TID Deno Etienne, DO   4 g at 05/03/21 1038  . DULoxetine (CYMBALTA) DR capsule 30 mg  30 mg Oral Q supper Deno Etienne, DO   30 mg at 05/03/21 1846  . hydrALAZINE (APRESOLINE) tablet 10 mg  10 mg Oral BID Deno Etienne, DO   10 mg at 05/04/21 1045  . hydrochlorothiazide 10 mg/mL oral suspension 6.25 mg  6.25 mg Oral Daily Deno Etienne, DO   6.25 mg at 05/04/21 1046  .  ibuprofen (ADVIL) tablet 800 mg  800 mg Oral TID Deno Etienne, DO   800 mg at 05/04/21 0827  . methocarbamol (ROBAXIN) tablet 500 mg  500 mg Oral Q8H PRN Pati Gallo S, PA   500 mg at 05/04/21 1044  . QUEtiapine (SEROQUEL) tablet 200 mg  200 mg Oral QHS PRN Deno Etienne, DO   200 mg at 05/03/21 2110  . tamsulosin (FLOMAX) capsule 0.4 mg  0.4 mg Oral Once Deno Etienne, DO      . tamsulosin [facility]) capsule 0.4 mg  0.4 mg Oral Daily Deno Etienne, DO   0.4 mg at 05/04/21 1045  . traZODone (DESYREL) tablet 50 mg  50 mg Oral QHS Deno Etienne, DO       Current Outpatient Medications  Medication Sig Dispense Refill  . amLODipine (NORVASC) 10 MG tablet Take 10 mg by mouth daily.    . Cholecalciferol (VITAMIN D3) 25 MCG (1000 UT) CAPS Take 1,000 Units by mouth daily.    . DULoxetine (CYMBALTA) 30 MG capsule Take 30 mg by mouth daily with supper.    . hydrALAZINE (APRESOLINE) 10 MG tablet Take 10 mg by mouth 2 (two) times daily.    . hydrochlorothiazide (HYDRODIURIL) 12.5 MG tablet Take 6.25 mg by mouth daily.    Marland Kitchen ibuprofen (ADVIL) 800 MG tablet Take 800 mg by mouth 3 (three) times daily.    . Omega-3 Fatty Acids (FISH OIL) 1000 MG CAPS Take 1,000 mg by mouth daily.    Marland Kitchen oxyCODONE-acetaminophen (PERCOCET/ROXICET) 5-325 MG tablet Take 1 tablet by mouth every 8 (eight) hours as needed for severe pain or moderate pain.    Marland Kitchen QUEtiapine (SEROQUEL) 400 MG tablet Take 200 mg by mouth  See admin instructions. Take 200 mg by mouth at bedtime, if not taking Trazodone    . tamsulosin (FLOMAX) 0.4 MG CAPS capsule Take 0.4 mg by mouth daily.    . VOLTAREN 1 % GEL Apply 4 g topically See admin instructions. Apply 4 grams to painful sites two times a day and at bedtime    . HYDROcodone-acetaminophen (NORCO/VICODIN) 5-325 MG tablet Take 1 tablet by mouth every 4 (four) hours as needed. (Patient not taking: No sig reported) 10 tablet 0  . traZODone (DESYREL) 50 MG tablet Take 50 mg by mouth See admin instructions. Take 50 mg by mouth at bedtime, if not taking Quetiapine (Patient not taking: No sig reported)      Musculoskeletal: Strength & Muscle Tone: within normal limits Gait & Station: normal Patient leans: N/A   Psychiatric Specialty Exam:  Presentation  General Appearance: Appropriate for Environment  Eye Contact:Good  Speech:Clear and Coherent; Normal Rate  Speech Volume:Normal  Handedness:Right   Mood and Affect  Mood:Depressed; Angry  Affect:Congruent   Thought Process  Thought Processes:Coherent; Goal Directed  Descriptions of Associations:Intact  Orientation:Full (Time, Place and Person)  Thought Content:WDL  History of Schizophrenia/Schizoaffective disorder:No  Duration of Psychotic Symptoms:Less than six months  Hallucinations:Hallucinations: None  Ideas of Reference:None  Suicidal Thoughts:Suicidal Thoughts: Yes, Passive SI Passive Intent and/or Plan: Without Intent; Without Plan  Homicidal Thoughts:Homicidal Thoughts: Yes, Passive ("I'm still angry and thoughts of hurting him") HI Active Intent and/or Plan: Without Intent   Sensorium  Memory:Immediate Good; Recent Good  Judgment:Fair  Insight:Fair; Present   Executive Functions  Concentration:Good  Attention Span:Good  [place] of Knowledge:Good  Language:Good   Psychomotor Activity  Psychomotor Activity:Psychomotor Activity: Normal   Assets   Assets:Desire for Improvement; Armed forces logistics/support/administrative officer; Resilience  Sleep  Sleep:Sleep: Fair    Physical Exam: Physical Exam Vitals and nursing note reviewed. Exam conducted with a chaperone present.  Constitutional:      General: He is not in acute distress.    Appearance: Normal appearance. He is not ill-appearing.  Cardiovascular:     Rate and Rhythm: Normal rate.  Pulmonary:     Effort: Pulmonary effort is normal.  Neurological:     Mental Status: He is alert and oriented to person, place, and time.  Psychiatric:        Attention and Perception: Attention and perception normal. He does not perceive auditory or visual hallucinations.        Mood and Affect: Mood is anxious and depressed.        Speech: Speech normal.        Behavior: Behavior normal. Behavior is cooperative.        Thought Content: Thought content is not paranoid or delusional. Thought content includes homicidal and suicidal ideation.        Cognition and Memory: Cognition and memory normal.        Judgment: Judgment is impulsive.   Review of Systems  Constitutional: Negative.   HENT: Negative.    Eyes: Negative.   Respiratory: Negative.    Cardiovascular: Negative.   Gastrointestinal: Negative.   Genitourinary:        Reporting improvement with urination since he has been back on medications  Musculoskeletal:  Positive for back pain.  Skin: Negative.   Neurological: Negative.   Endo/Heme/Allergies: Negative.   Psychiatric/Behavioral:  Positive for depression and suicidal ideas. Negative for hallucinations. The patient is nervous/anxious.        Patient reporting want to kill the person that he done work for and now won't pay him.  Blood pressure 118/81, pulse 72, temperature 97.8 F (36.6 C), temperature source Oral, resp. rate (!) 22, SpO2 100 %. There is no height or weight on file to calculate BMI.  Treatment Plan Summary: Daily contact with patient to assess and evaluate symptoms and progress  in treatment, Medication management, and Plan Inpatient psychiatric treatment   Medication management:  Home medications restarted . acetaminophen  650 mg Oral Q6H  . amLODipine  10 mg Oral Daily  . diclofenac Sodium  4 g Topical TID  . DULoxetine  30 mg Oral Q supper  . hydrALAZINE  10 mg Oral BID  . hydrochlorothiazide  6.25 mg Oral Daily  . ibuprofen  800 mg Oral TID  . tamsulosin  0.4 mg Oral Once  . tamsulosin  0.4 mg Oral Daily  . traZODone  50 mg Oral QHS   Seroquel 200 mg Q hs prn if doesn't take Trazodone  Disposition: Recommend psychiatric Inpatient admission when medically cleared.  This service was provided via telemedicine using a 2-way, interactive audio and video technology.  Names of all persons participating in this telemedicine service and their role in this encounter. Name: Earleen Newport Role: NP  Name: Dr. Hampton Abbot Role: Psychiatrist  Name: Clifford Edwards Role: Patient  Name: Orland Dec, RN Role: Patient's nurse sent secure message informing:  Psychiatric consult reassessment complete; recommend for inpatient psychiatric treatment.  If no available bed at [facility], will seek appropriate bed at surrounding facilities for inpatient psychiatric treatment.  Patient currently being reviewed by [facility].  Please inform MD only default listed    Earleen Newport, NP 05/04/2021 12:07 PM

## 2021-05-04 NOTE — BH Assessment (Addendum)
Disposition Note:   Clifford Rankin, NP, recommend psychiatric inpatient admission when medically cleared.  Clinician received notice from first shift LCSW,  "ED is currently working on IVC paperwork to fax to [facility] [phone]. No acceptance information has been provided as of now. Once IVC is received acceptance information will be provided.   As stated in a secure chat by first shift Social Worker, Clifford Edwards was requested to ask the next shift member to submit IVC paperwork via fax prior to 5 PM [phone]).  '@[phone]'$ , Reached out to patient's nurse Clifford Knudsen, RN) to follow with IVC papers that were requested to be faxed to [facility] . '@[id number]'$ , Patient's nurse indicated that she would fax IVC document to [facility].   Morning shift LCSW to follow up wit [facility] regarding patient's pending accepting to their facility.

## 2021-05-04 NOTE — ED Notes (Signed)
Pt showered

## 2021-05-04 NOTE — ED Provider Notes (Signed)
Emergency Medicine Observation Re-evaluation Note  Amadeus Schanbacher is a 60 y.o. male, seen on rounds today.  Pt initially presented to the ED for complaints of Back Pain, Urinary Retention, and Post-Traumatic Stress Disorder Currently, the patient is complaining of back pain.  Physical Exam  BP 109/78   Pulse 72   Temp 97.8 F (36.6 C) (Oral)   Resp (!) 22   SpO2 100%  Physical Exam CONSTITUTIONAL:  well-appearing, NAD NEURO:  Alert and oriented x 3, no focal deficits EYES:  pupils equal and reactive ENT/NECK:  trachea midline, no JVD CARDIO:  reg rate, reg rhythm, well-perfused PULM:  None labored breathing GI/GU:  Abdomen non-distended MSK/SPINE:  No gross deformities, no edema SKIN:  no rash obvious, atraumatic, no ecchymosis  PSYCH:     ED Course / MDM  EKG:   I have reviewed the labs performed to date as well as medications administered while in observation.  Recent changes in the last 24 hours include none.  Plan  Current plan is for INPT treatmentnone.  Holl[facility] Mace[facility] is not under involuntary commitment.  Patient is medically cleared and awaiting inpatient psychiatric placement.   Pati Gallo [place], [place] 05/04/21 1000    Blanchie Dessert, MD 05/04/21 1358

## 2021-05-04 NOTE — ED Notes (Addendum)
The patient was restless stating that he needed a doctor because he was in a lot of pain. Brittany-RN was informed. Vital signs were within normal limits.

## 2021-05-04 NOTE — ED Notes (Signed)
Pt ambulating outside of room.

## 2021-05-05 NOTE — ED Provider Notes (Signed)
Emergency Medicine Observation Re-evaluation Note  Dreux Karlovich is a 60 y.o. male, seen on rounds today.  Pt initially presented to the ED for complaints of Back Pain, Urinary Retention, and Post-Traumatic Stress Disorder Currently, the patient is resting comfortably at 0750.  Physical Exam  BP 100/72 (BP Location: Left Arm)   Pulse 75   Temp 97.7 F (36.5 C)   Resp 13   SpO2 100%  Physical Exam General: NAD   ED Course / MDM  EKG:EKG Interpretation  Date/Time:  Wednesday May 04 2021 09:55:11 EDT Ventricular Rate:  84 PR Interval:  144 QRS Duration: 94 QT Interval:  372 QTC Calculation: 439 R Axis:   85 Text Interpretation: Normal sinus rhythm Normal ECG Confirmed by Blanchie Dessert [phone]) on 05/04/2021 2:49:29 PM  I have reviewed the labs performed to date as well as medications administered while in observation.  Recent changes in the last 24 hours include no acute events reported by staff.  Plan  Current plan is for placement. [facility] is under involuntary commitment.      Valarie Merino, MD 05/05/21 [id number]

## 2021-05-05 NOTE — Progress Notes (Signed)
Pt accepted to [facility]     Patient meets inpatient criteria per Azucena Freed, NP   Dr. Genia Del is the attending provider.    Call report to [phone]   Baker Pierini, RN @ [facility] ED notified.     Pt scheduled  to arrive at [facility] today BEFORE 1900.   Mariea Clonts, MSW, LCSW-A  8:40 AM 05/05/2021

## 2021-06-19 ENCOUNTER — Emergency Department (HOSPITAL_COMMUNITY)
Admission: EM | Admit: 2021-06-19 | Discharge: 2021-06-19 | Disposition: A | Discharge status: Home / Self Care | Payer: No Typology Code available for payment source | Attending: Emergency Medicine | Admitting: Emergency Medicine

## 2021-06-19 ENCOUNTER — Emergency Department (HOSPITAL_COMMUNITY)
Admission: EM | Admit: 2021-06-19 | Discharge: 2021-06-21 | Disposition: A | Discharge status: Other Acute Inpatient Hospital | Payer: No Typology Code available for payment source | Attending: Emergency Medicine | Admitting: Emergency Medicine

## 2021-06-19 ENCOUNTER — Emergency Department (HOSPITAL_COMMUNITY): Payer: No Typology Code available for payment source

## 2021-06-19 ENCOUNTER — Other Ambulatory Visit: Payer: Self-pay

## 2021-06-19 ENCOUNTER — Encounter (HOSPITAL_COMMUNITY): Payer: Self-pay | Admitting: Emergency Medicine

## 2021-06-19 DIAGNOSIS — Z20822 Contact with and (suspected) exposure to covid-19: Secondary | ICD-10-CM | POA: Diagnosis not present

## 2021-06-19 DIAGNOSIS — F4310 Post-traumatic stress disorder, unspecified: Secondary | ICD-10-CM | POA: Insufficient documentation

## 2021-06-19 DIAGNOSIS — R079 Chest pain, unspecified: Secondary | ICD-10-CM | POA: Insufficient documentation

## 2021-06-19 DIAGNOSIS — R45850 Homicidal ideations: Secondary | ICD-10-CM | POA: Insufficient documentation

## 2021-06-19 DIAGNOSIS — F430 Acute stress reaction: Secondary | ICD-10-CM

## 2021-06-19 DIAGNOSIS — I10 Essential (primary) hypertension: Secondary | ICD-10-CM | POA: Insufficient documentation

## 2021-06-19 DIAGNOSIS — R110 Nausea: Secondary | ICD-10-CM | POA: Diagnosis not present

## 2021-06-19 DIAGNOSIS — Z8546 Personal history of malignant neoplasm of prostate: Secondary | ICD-10-CM | POA: Diagnosis not present

## 2021-06-19 DIAGNOSIS — Z79899 Other long term (current) drug therapy: Secondary | ICD-10-CM | POA: Insufficient documentation

## 2021-06-19 DIAGNOSIS — R072 Precordial pain: Secondary | ICD-10-CM

## 2021-06-19 LAB — BASIC METABOLIC PANEL
Anion gap: 10 (ref 5–15)
BUN: 12 mg/dL (ref 6–20)
CO2: 26 mmol/L (ref 22–32)
Calcium: 9.7 mg/dL (ref 8.9–10.3)
Chloride: 100 mmol/L (ref 98–111)
Creatinine, Ser: 1.28 mg/dL — ABNORMAL HIGH (ref 0.61–1.24)
GFR, Estimated: >60 mL/min (ref >60)
Glucose, Bld: 99 mg/dL (ref 70–99)
Potassium: 3.7 mmol/L (ref 3.5–5.1)
Sodium: 136 mmol/L (ref 135–145)

## 2021-06-19 LAB — CBC
HCT: 39.2 % (ref 39.0–52.0)
Hemoglobin: 13.4 g/dL (ref 13.0–17.0)
MCH: 30 pg (ref 26.0–34.0)
MCHC: 34.2 g/dL (ref 30.0–36.0)
MCV: 87.7 fL (ref 80.0–100.0)
Platelets: 243 10*3/uL (ref 150–400)
RBC: 4.47 MIL/uL (ref 4.22–5.81)
RDW: 13.5 % (ref 11.5–15.5)
WBC: 5.9 10*3/uL (ref 4.0–10.5)
nRBC: 0 % (ref 0.0–0.2)

## 2021-06-19 LAB — TROPONIN I (HIGH SENSITIVITY)
Troponin I (High Sensitivity): 9 ng/L (ref <18)
Troponin I (High Sensitivity): 7 ng/L (ref <18)

## 2021-06-19 NOTE — ED Provider Notes (Signed)
Emergency Medicine Provider Triage Evaluation Note  Clifford Edwards , a 60 y.o. male  was evaluated in triage.  Pt complains of homicidal ideations and would like admission into the psychiatric hospital. He was seen and discharged here earlier today. When he went to security after discharge to obtain his knife, he told the security guard that he was going to "do something bad with it". The patient reports he was in the [employer] and has PTSD and "doesn't know what he would do to the man".   Review of Systems  Positive: Homicidal ideations Negative: Suicidal ideations, hallucinations  Physical Exam  BP (!) 128/110 (BP Location: Left Arm)   Pulse 87   Temp 98.5 F (36.9 C) (Oral)   Resp 16   SpO2 100%  Gen:   Awake, no distress   Resp:  Normal effort  MSK:   Moves extremities without difficulty  Other:    Medical Decision Making  Medically screening exam initiated at 9:54 PM.  Appropriate orders placed.  Clifford Edwards was informed that the remainder of the evaluation will be completed by another provider, this initial triage assessment does not replace that evaluation, and the importance of remaining in the ED until their evaluation is complete.  Patient has not left campus since obtaining previous labs. He has no complaints. Medically clear. ED Psych order set placed.    Sherrell Puller, PA-C 06/19/21 2158    Varney Biles, MD 06/20/21 [phone]

## 2021-06-19 NOTE — ED Notes (Signed)
Call [facility] for TTS: [phone]

## 2021-06-19 NOTE — ED Triage Notes (Signed)
Pt here to speak to psychiatrist on call, states his PTSD is starting to get to him. Was seen today for CP, brought knife w him & when he went to get knife returned to him, advised to be seen for mental health issues.   8/10 chronic pain

## 2021-06-19 NOTE — ED Notes (Signed)
Pt instructed to meet security at the ED lobby for his secured knife.

## 2021-06-19 NOTE — ED Triage Notes (Signed)
Pt to triage via [facility] from [place] getting ready work.  Reports sharp burning pain under L breast with nausea.  Pain non-radiating.  Denies SOB.  Pt pale and diaphoretic on EMS arrival.  BP 150/.  20 g LAC.  ASA 324mg  and NTG x 2.  Pain 10/10 to 7/10 after NTG.

## 2021-06-19 NOTE — ED Provider Notes (Signed)
Emergency Medicine Provider Triage Evaluation Note  Clifford Edwards , a 60 y.o. male  was evaluated in triage.  Pt complains of left-sided sharp chest pain since this morning.  He was given aspirin and nitroglycerin prior to arrival which improved his pain.  Reports associated headache but denies any shortness of breath, fever, chills, cough, congestion.  Review of Systems  Positive:  Negative: See above  Physical Exam  BP 93/73 (BP Location: Left Arm)   Pulse (!) 110   Temp 98.5 F (36.9 C) (Oral)   Resp 18   SpO2 100%  Gen:   Awake, no distress   Resp:  Normal effort  MSK:   Moves extremities without difficulty  Other:    Medical Decision Making  Medically screening exam initiated at 12:00 PM.  Appropriate orders placed.  Morell Mears was informed that the remainder of the evaluation will be completed by another provider, this initial triage assessment does not replace that evaluation, and the importance of remaining in the ED until their evaluation is complete.     Myna Bright [place], PA-C 06/19/21 [place], MD 06/20/21 [phone]

## 2021-06-19 NOTE — ED Notes (Signed)
E-signature pad unavailable at time of pt discharge. This RN discussed discharge materials with pt and answered all pt questions. Pt stated understanding of discharge material. ? ?

## 2021-06-19 NOTE — ED Notes (Signed)
Called pt 1x for vitals, no response. Believe pt went to cafeteria.
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
## 2021-06-19 NOTE — Discharge Instructions (Addendum)
It was our pleasure to provide your ER care today - we hope that you feel better.  Avoid smoking and/or substance use, as it is harmful to your physical and mental health and well-being.   For recent chest pain, follow up with primary care doctor/cardiologist in the next 1-2 weeks.   Also follow up with behavioral health/therapist in the next 1-2 weeks, and use additional resources provided to help access social services and substance abuse treatment.   Return to ER if worse, new symptoms, fevers, recurrent/persistent chest pain, increased trouble breathing, or other concern.

## 2021-06-20 LAB — RESP PANEL BY RT-PCR (FLU A&B, COVID) ARPGX2
Influenza A by PCR: NEGATIVE
Influenza B by PCR: NEGATIVE
SARS Coronavirus 2 by RT PCR: NEGATIVE

## 2021-06-20 MED ORDER — QUETIAPINE FUMARATE 200 MG PO TABS: 200.0000 mg | ORAL_TABLET | Freq: Every day | ORAL | Status: DC

## 2021-06-20 MED ORDER — HYDROCHLOROTHIAZIDE 12.5 MG PO TABS
6.2500 mg | ORAL_TABLET | Freq: Every day | ORAL | Status: DC
  Filled 2021-06-20 (×2): qty 1

## 2021-06-20 MED ORDER — ACETAMINOPHEN 500 MG PO TABS
1000.0000 mg | ORAL_TABLET | Freq: Four times a day (QID) | ORAL | Status: DC | PRN
  Administered 2021-06-20: 1000 mg via ORAL
  Filled 2021-06-20 (×2): qty 2

## 2021-06-20 MED ORDER — IBUPROFEN 400 MG PO TABS
600.0000 mg | ORAL_TABLET | Freq: Once | ORAL | Status: AC
  Administered 2021-06-20: 600 mg via ORAL
  Filled 2021-06-20: qty 1

## 2021-06-20 MED ORDER — DULOXETINE HCL 30 MG PO CPEP: 30.0000 mg | ORAL_CAPSULE | Freq: Every day | ORAL | Status: DC

## 2021-06-20 MED ORDER — LIDOCAINE 5 % EX PTCH
1.0000 | MEDICATED_PATCH | CUTANEOUS | Status: DC
  Administered 2021-06-20 – 2021-06-21 (×2): 1 via TRANSDERMAL
  Filled 2021-06-20 (×2): qty 1

## 2021-06-20 MED ORDER — AMLODIPINE BESYLATE 5 MG PO TABS
10.0000 mg | ORAL_TABLET | Freq: Every day | ORAL | Status: DC
  Administered 2021-06-20 – 2021-06-21 (×2): 10 mg via ORAL
  Filled 2021-06-20 (×2): qty 2

## 2021-06-20 MED ORDER — PRAZOSIN HCL 1 MG PO CAPS
1.0000 mg | ORAL_CAPSULE | Freq: Every day | ORAL | Status: DC
  Administered 2021-06-20: 1 mg via ORAL
  Filled 2021-06-20 (×2): qty 1

## 2021-06-20 MED ORDER — ASPIRIN EC 81 MG PO TBEC
81.0000 mg | DELAYED_RELEASE_TABLET | Freq: Every day | ORAL | Status: DC
  Administered 2021-06-20 – 2021-06-21 (×2): 81 mg via ORAL
  Filled 2021-06-20 (×2): qty 1

## 2021-06-20 MED ORDER — QUETIAPINE FUMARATE 200 MG PO TABS
200.0000 mg | ORAL_TABLET | Freq: Every day | ORAL | Status: DC
  Administered 2021-06-20: 200 mg via ORAL
  Filled 2021-06-20 (×2): qty 1

## 2021-06-20 MED ORDER — METHOCARBAMOL 500 MG PO TABS
500.0000 mg | ORAL_TABLET | Freq: Once | ORAL | Status: AC
  Administered 2021-06-20: 500 mg via ORAL
  Filled 2021-06-20: qty 1

## 2021-06-20 MED ORDER — PRAZOSIN HCL 1 MG PO CAPS: 1.0000 mg | ORAL_CAPSULE | Freq: Every day | ORAL | Status: DC

## 2021-06-20 MED ORDER — DULOXETINE HCL 30 MG PO CPEP
30.0000 mg | ORAL_CAPSULE | Freq: Every day | ORAL | Status: DC
  Administered 2021-06-20 – 2021-06-21 (×2): 30 mg via ORAL
  Filled 2021-06-20 (×3): qty 1

## 2021-06-20 MED ORDER — HYDROCHLOROTHIAZIDE 10 MG/ML ORAL SUSPENSION
6.2500 mg | Freq: Every day | ORAL | Status: DC
  Administered 2021-06-20 – 2021-06-21 (×2): 6.25 mg via ORAL
  Filled 2021-06-20 (×2): qty 1.25

## 2021-06-20 MED ORDER — HYDRALAZINE HCL 10 MG PO TABS
10.0000 mg | ORAL_TABLET | Freq: Two times a day (BID) | ORAL | Status: DC
  Administered 2021-06-20 – 2021-06-21 (×3): 10 mg via ORAL
  Filled 2021-06-20 (×4): qty 1

## 2021-06-20 MED ORDER — TAMSULOSIN HCL 0.4 MG PO CAPS
0.4000 mg | ORAL_CAPSULE | Freq: Every day | ORAL | Status: DC
  Administered 2021-06-20 – 2021-06-21 (×2): 0.4 mg via ORAL
  Filled 2021-06-20 (×2): qty 1
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
## 2021-06-20 NOTE — Progress Notes (Signed)
CSW received a returned call from [facility] who confirmed bed availability. The Transfer Coordinator agreed to review patient information after 1600. CSW gathered information need to provide to 2nd shift disposition Education officer, museum. CSW has requested a rapid COVID test to be conducted to submit with requested information. 2nd shift disposition Education officer, museum to follow-up.   Mariea Clonts, MSW, LCSW-A  2:59 PM 06/20/2021

## 2021-06-20 NOTE — Progress Notes (Signed)
CSW faxed pt's referral with [place] Transfer Information Sheet. CSW called [facility] [phone] ext: [phone] and spoke with Suanne Marker who advised that the referral along with negative COVID-19 was received. CSW was advised to have first shift CSW follow up tomorrow.   Benjaman Kindler, MSW, [facility] 06/20/2021 11:48 PM

## 2021-06-20 NOTE — ED Provider Notes (Signed)
Emergency Medicine Observation Re-evaluation Note  Clifford Edwards is a 60 y.o. male, seen on rounds today.  Pt initially presented to the ED for complaints of Homicidal Currently, the patient is resting comfortable.  Physical Exam  BP 123/86   Pulse 80   Temp 98.6 F (37 C)   Resp 16   SpO2 99%  Physical Exam General: comfortable Lungs: resp even and unlabored Psych: calm  ED Course / MDM  EKG:   I have reviewed the labs performed to date as well as medications administered while in observation.  Recent changes in the last 24 hours include none.  Plan  Current plan is for inpatient, home meds ordered.  Clifford Edwards is not under involuntary commitment.     Truddie Hidden, MD 06/20/21 [phone]
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
## 2021-06-20 NOTE — Progress Notes (Signed)
CSW contacted [facility] via phone to speak with a transfer coordinator, but there was no answer. CSW left a voice message asking the coordinator to return the call. CSW is awaiting to hear back from the coordinators.    Mariea Clonts, MSW, LCSW-A  2:12 PM 06/20/2021

## 2021-06-21 MED ORDER — IBUPROFEN 400 MG PO TABS
600.0000 mg | ORAL_TABLET | Freq: Once | ORAL | Status: AC
  Administered 2021-06-21: 600 mg via ORAL
  Filled 2021-06-21: qty 1

## 2021-06-21 MED ORDER — IBUPROFEN 800 MG PO TABS
800.0000 mg | ORAL_TABLET | Freq: Once | ORAL | Status: AC
  Administered 2021-06-21: 800 mg via ORAL
  Filled 2021-06-21: qty 1

## 2021-06-21 NOTE — Progress Notes (Signed)
CSW spoke with [facility] at 1150 regarding referral status. Per Transfer Coordinator, the original intake referral was incomplete and needed additional information to attach. CSW gathered additional information and refaxed it to [phone]. Bed availability is still open at [facility] hospital.    Mariea Clonts, MSW, LCSW-A  12:39 PM 06/21/2021

## 2021-06-21 NOTE — ED Notes (Signed)
Pt sleeping at this time. Will obtain vitals when pt wakes up.

## 2021-06-21 NOTE — Progress Notes (Signed)
CSW received signed [place] Form [id number] from ED MD and attached it to the complete [facility] referral form and re-sent it to the [facility] for review. 2nd shift disposition Education officer, museum to follow-up regarding acceptance.    Mariea Clonts, MSW, LCSW-A  3:00 PM 06/21/2021

## 2021-06-21 NOTE — ED Notes (Signed)
Pt appears to be sleeping, respirations even and unlabored.

## 2021-06-21 NOTE — Progress Notes (Signed)
CSW spoke with April Alexander who is the [facility] Nurse Case Manager. April informed CSW that pt would be accepted to the [facility] pending the [facility]'s form [id number]. CSW coordinated with Dr. Lajean Saver to obtain form successfully.   @5 :57pm CSW called and spoke with the AOD with the [facility] who advised that form [id number] was received. CSW inquired about bed acceptance information and was advised that CSW would receive another phone call back.   Benjaman Kindler, MSW, [facility] 06/21/2021 6:21 PM

## 2021-06-21 NOTE — Progress Notes (Addendum)
EDP, Teressa Lower, MD, added for EMTALA to secure chat to be informed about update on pt's disposition in reference to pt being accepted to the [facility] for inaptient behavioral health placement. Ma Samantha Crimes, RN advised that report was called to the [facility] at [place], MSW, [facility] 06/21/2021 8:10 PM

## 2021-06-21 NOTE — Progress Notes (Signed)
CSW spoke with [employer] who confirmed receiving intake referral but informed CSW that it the [place] Form [id number] is missing. CSW provided the coordinator with her email in order to receive the information. CSW is awaiting to receive this second form currently. This additional form was not provided during the initial email sent by [facility] coordinator. Bed availability is still open.   Mariea Clonts, MSW, LCSW-A  2:25 PM 06/21/2021

## 2021-06-21 NOTE — ED Notes (Signed)
Pt locked medications from pharmacy and locked belongings from security taken to purple zone in preparation for pt to leave when [facility] gets here to take pt to [facility].

## 2021-06-21 NOTE — Progress Notes (Addendum)
Pt was accepted to [facility] today 06/21/21 ASAP/ anytime depending on transportation. Pt has been accepted to [facility] to [facility].   Pt meets inpatient criteria per [name], PA  Accepting Physician: Dr. Judye Bos  Report can be called to:  Pt can arrive anytime on 06/21/21   Care team notified vis secure chat: Ma Samantha Crimes, RN, Lajean Saver, MD. CSW requested that EDP and Blue doc at [facility] be added so they are aware of pt's disposition.   Pt will need [facility] the [facility] first to complete a COVID-19 test.  Nadara Mode, LCSWA 06/21/2021 @ 6:21 PM

## 2021-06-21 NOTE — Progress Notes (Signed)
CSW spoke with ED MD Dr. Ashok Cordia regarding additional [place] Form [phone]. He has agreed to sign it with his signature and the Pt and to send it via email, so that the referral packet can be completed. 2nd shift disposition social worker will send the packet via fax to [phone] (before 4 PM) and/or [phone] (after 4 PM).   Mariea Clonts, MSW, LCSW-A  2:52 PM 06/21/2021

## 2021-06-21 NOTE — ED Notes (Signed)
Pt continues to appear to be sleeping, respirations remain even and unlabored. Pt has not eaten breakfast.

## 2021-06-21 NOTE — ED Notes (Addendum)
Pt has been accepted to [facility] to [facility]. Accepting physician is Dr. Judye Bos. Report called to [place]. Will notify EDP for EMTALA and Safe [facility] to transport pt to [place].

## 2021-06-21 NOTE — ED Notes (Signed)
Safe Transport called 

## 2021-12-16 ENCOUNTER — Ambulatory Visit (HOSPITAL_COMMUNITY)
Admission: EM | Admit: 2021-12-16 | Discharge: 2021-12-18 | Disposition: A | Discharge status: Home / Self Care | Payer: No Typology Code available for payment source | Attending: Psychiatry | Admitting: Psychiatry

## 2021-12-16 DIAGNOSIS — R45851 Suicidal ideations: Secondary | ICD-10-CM | POA: Diagnosis not present

## 2021-12-16 DIAGNOSIS — F4310 Post-traumatic stress disorder, unspecified: Secondary | ICD-10-CM | POA: Diagnosis present

## 2021-12-16 DIAGNOSIS — R45850 Homicidal ideations: Secondary | ICD-10-CM

## 2021-12-16 DIAGNOSIS — M25519 Pain in unspecified shoulder: Secondary | ICD-10-CM | POA: Diagnosis not present

## 2021-12-16 DIAGNOSIS — Z20822 Contact with and (suspected) exposure to covid-19: Secondary | ICD-10-CM | POA: Diagnosis not present

## 2021-12-16 LAB — COMPREHENSIVE METABOLIC PANEL
ALT: 37 U/L (ref 0–44)
AST: 78 U/L — ABNORMAL HIGH (ref 15–41)
Albumin: 4.6 g/dL (ref 3.5–5.0)
Alkaline Phosphatase: 44 U/L (ref 38–126)
Anion gap: 10 (ref 5–15)
BUN: 13 mg/dL (ref 6–20)
CO2: 28 mmol/L (ref 22–32)
Calcium: 10.4 mg/dL — ABNORMAL HIGH (ref 8.9–10.3)
Chloride: 96 mmol/L — ABNORMAL LOW (ref 98–111)
Creatinine, Ser: 1 mg/dL (ref 0.61–1.24)
GFR, Estimated: >60 mL/min (ref >60)
Glucose, Bld: 101 mg/dL — ABNORMAL HIGH (ref 70–99)
Potassium: 3.9 mmol/L (ref 3.5–5.1)
Sodium: 134 mmol/L — ABNORMAL LOW (ref 135–145)
Total Bilirubin: 1 mg/dL (ref 0.3–1.2)
Total Protein: 7 g/dL (ref 6.5–8.1)

## 2021-12-16 LAB — CBC WITH DIFFERENTIAL/PLATELET
Abs Immature Granulocytes: 0.03 10*3/uL (ref 0.00–0.07)
Basophils Absolute: 0 10*3/uL (ref 0.0–0.1)
Basophils Relative: 1 %
Eosinophils Absolute: 0.2 10*3/uL (ref 0.0–0.5)
Eosinophils Relative: 2 %
HCT: 42.3 % (ref 39.0–52.0)
Hemoglobin: 14.6 g/dL (ref 13.0–17.0)
Immature Granulocytes: 0 %
Lymphocytes Relative: 35 %
Lymphs Abs: 2.8 10*3/uL (ref 0.7–4.0)
MCH: 30.1 pg (ref 26.0–34.0)
MCHC: 34.5 g/dL (ref 30.0–36.0)
MCV: 87.2 fL (ref 80.0–100.0)
Monocytes Absolute: 0.8 10*3/uL (ref 0.1–1.0)
Monocytes Relative: 10 %
Neutro Abs: 4.2 10*3/uL (ref 1.7–7.7)
Neutrophils Relative %: 52 %
Platelets: 295 10*3/uL (ref 150–400)
RBC: 4.85 MIL/uL (ref 4.22–5.81)
RDW: 13.6 % (ref 11.5–15.5)
WBC: 8 10*3/uL (ref 4.0–10.5)
nRBC: 0 % (ref 0.0–0.2)

## 2021-12-16 LAB — ETHANOL: Alcohol, Ethyl (B): <10 mg/dL (ref <10)

## 2021-12-16 LAB — LIPID PANEL
Cholesterol: 191 mg/dL (ref 0–200)
HDL: 65 mg/dL (ref >40)
LDL Cholesterol: 117 mg/dL — ABNORMAL HIGH (ref 0–99)
Total CHOL/HDL Ratio: 2.9 RATIO
Triglycerides: 47 mg/dL (ref <150)
VLDL: 9 mg/dL (ref 0–40)

## 2021-12-16 LAB — HEMOGLOBIN A1C
Hgb A1c MFr Bld: 5.5 % (ref 4.8–5.6)
Mean Plasma Glucose: 111.15 mg/dL

## 2021-12-16 LAB — RESP PANEL BY RT-PCR (FLU A&B, COVID) ARPGX2
Influenza A by PCR: NEGATIVE
Influenza B by PCR: NEGATIVE
SARS Coronavirus 2 by RT PCR: NEGATIVE

## 2021-12-16 LAB — TSH: TSH: 1.623 u[IU]/mL (ref 0.350–4.500)

## 2021-12-16 LAB — MAGNESIUM: Magnesium: 2.1 mg/dL (ref 1.7–2.4)

## 2021-12-16 MED ORDER — MAGNESIUM HYDROXIDE 400 MG/5ML PO SUSP: 30.0000 mL | Freq: Every day | ORAL | Status: DC | PRN

## 2021-12-16 MED ORDER — ALUM & MAG HYDROXIDE-SIMETH 200-200-20 MG/5ML PO SUSP: 30.0000 mL | ORAL | Status: DC | PRN

## 2021-12-16 MED ORDER — ASPIRIN EC 81 MG PO TBEC
81.0000 mg | DELAYED_RELEASE_TABLET | Freq: Every day | ORAL | Status: DC
  Administered 2021-12-17 – 2021-12-18 (×2): 81 mg via ORAL
  Filled 2021-12-16 (×2): qty 1

## 2021-12-16 MED ORDER — ZIPRASIDONE MESYLATE 20 MG IM SOLR: 20.0000 mg | INTRAMUSCULAR | Status: DC | PRN

## 2021-12-16 MED ORDER — OLANZAPINE 10 MG PO TBDP
10.0000 mg | ORAL_TABLET | Freq: Three times a day (TID) | ORAL | Status: DC | PRN
  Administered 2021-12-16: 10 mg via ORAL
  Filled 2021-12-16: qty 1

## 2021-12-16 MED ORDER — TRAZODONE HCL 50 MG PO TABS
50.0000 mg | ORAL_TABLET | Freq: Every evening | ORAL | Status: DC | PRN
  Administered 2021-12-17: 50 mg via ORAL
  Filled 2021-12-16: qty 1

## 2021-12-16 MED ORDER — AMLODIPINE BESYLATE 10 MG PO TABS
10.0000 mg | ORAL_TABLET | Freq: Every day | ORAL | Status: DC
  Administered 2021-12-16 – 2021-12-18 (×3): 10 mg via ORAL
  Filled 2021-12-16 (×3): qty 1

## 2021-12-16 MED ORDER — ACETAMINOPHEN 325 MG PO TABS
650.0000 mg | ORAL_TABLET | Freq: Three times a day (TID) | ORAL | Status: DC | PRN
  Administered 2021-12-16 – 2021-12-18 (×3): 650 mg via ORAL
  Filled 2021-12-16 (×3): qty 2

## 2021-12-16 MED ORDER — HYDROXYZINE HCL 25 MG PO TABS
25.0000 mg | ORAL_TABLET | Freq: Three times a day (TID) | ORAL | Status: DC | PRN
  Administered 2021-12-17: 25 mg via ORAL
  Filled 2021-12-16 (×2): qty 1

## 2021-12-16 MED ORDER — TAMSULOSIN HCL 0.4 MG PO CAPS
0.4000 mg | ORAL_CAPSULE | Freq: Every day | ORAL | Status: DC
  Administered 2021-12-16 – 2021-12-18 (×3): 0.4 mg via ORAL
  Filled 2021-12-16 (×3): qty 1

## 2021-12-16 MED ORDER — LORAZEPAM 1 MG PO TABS: 1.0000 mg | ORAL_TABLET | ORAL | Status: DC | PRN

## 2021-12-16 MED ORDER — DULOXETINE HCL 60 MG PO CPEP
60.0000 mg | ORAL_CAPSULE | Freq: Every day | ORAL | Status: DC
  Administered 2021-12-16 – 2021-12-18 (×3): 60 mg via ORAL
  Filled 2021-12-16 (×3): qty 1

## 2021-12-16 MED ORDER — METHOCARBAMOL 500 MG PO TABS
500.0000 mg | ORAL_TABLET | Freq: Two times a day (BID) | ORAL | Status: DC | PRN
  Administered 2021-12-17 – 2021-12-18 (×2): 500 mg via ORAL
  Filled 2021-12-16 (×2): qty 1

## 2021-12-16 MED ORDER — GABAPENTIN 300 MG PO CAPS
300.0000 mg | ORAL_CAPSULE | Freq: Three times a day (TID) | ORAL | Status: DC
  Administered 2021-12-17 – 2021-12-18 (×5): 300 mg via ORAL
  Filled 2021-12-16 (×5): qty 1
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
## 2021-12-16 NOTE — Progress Notes (Signed)
Patient continues to rest with even and unlabored respirations at this time, Nursing staff will continue to monitor. ?

## 2021-12-16 NOTE — Progress Notes (Signed)
Patient now resting on the unit, respirations are even and unlabored at this time. Nursing staff will continue to monitor. ?

## 2021-12-17 ENCOUNTER — Encounter (HOSPITAL_COMMUNITY): Payer: Self-pay | Admitting: Registered Nurse

## 2021-12-17 DIAGNOSIS — F4310 Post-traumatic stress disorder, unspecified: Secondary | ICD-10-CM | POA: Diagnosis not present

## 2021-12-17 DIAGNOSIS — R45850 Homicidal ideations: Secondary | ICD-10-CM | POA: Diagnosis not present

## 2021-12-17 LAB — RPR: RPR Ser Ql: NONREACTIVE

## 2021-12-17 NOTE — Progress Notes (Signed)
Per Shuvon Rankin,NP, patient meets criteria for inpatient treatment. There are no available or appropriate beds at [facility] today. CSW faxed referrals to the following facilities for review: ? ?[address] [place] [address] [phone] --  ?[facility] N/A [address], [place] [address] [phone] --  ?[facility]  Pending - Request Sent N/A [address] [place] [address] [phone] --  ?[facility]  Pending - Request Sent N/A [facility] [address] [place] [address] [phone] --  ?[facility]  Pending - Request Sent N/A [address] [place] [address] [phone] --  ?[facility]  Pending - Request Sent N/A [address] [place] [address] [phone] --  ?[facility]  Pending - Request Sent N/A [address] [place], [place] [address] [phone] --  ?[facility]  Pending - Request Sent N/A [address], [place] [address] [phone] --  ?[facility] [address] [place] [address] [phone] --  ?[facility] [address] [place] [address] [phone] --  ?[place] N/A [address], [place] [address] [phone] --  ?[facility]  Pending - Request Sent N/A [address] [place] [address] [phone] --  ?[facility]  Pending - Request Sent N/A [place] [place] [address] [phone] --  ?[facility]  Pending - Request Sent N/A [address] [place] [address] [phone] --  ?[facility]  Pending - Request Sent N/A [address] [place] [address] [phone] --  ? ?TTS will continue to seek bed placement. ? ?Glennie [place], MSW, LCSW-A, LCAS-A ?Phone: [phone] ?Disposition/TOC ? ?

## 2021-12-17 NOTE — ED Notes (Signed)
Tried to offer the pt some lunch, he is not responding  ?

## 2021-12-17 NOTE — ED Notes (Signed)
Patient received all of his medications as scheduled. Patient reported he was suicidal and homicidal. Patient ate his breakfast and has been peaceful through out the shift.  ?

## 2021-12-17 NOTE — ED Notes (Signed)
Patient awakened with leg pain. Patient compliant with medication administration. Patient currently sleeping. Respirations are even and unlabored. No acute distress noted at this time. Will continue to  monitor for safety. ?

## 2021-12-17 NOTE — ED Notes (Signed)
Dinner was given, pt had 2 meals ?

## 2021-12-17 NOTE — ED Notes (Signed)
Pt A&O x 4, conversing freely with staff, complaint of rt sided & neck pain. Rates as 8 on pain scale.  Pt calm & cooperative at present,  monitoring for safety. ?
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
## 2021-12-18 ENCOUNTER — Other Ambulatory Visit: Payer: Self-pay

## 2021-12-18 ENCOUNTER — Encounter (HOSPITAL_COMMUNITY): Payer: Self-pay | Admitting: Emergency Medicine

## 2021-12-18 ENCOUNTER — Emergency Department (HOSPITAL_COMMUNITY): Payer: No Typology Code available for payment source

## 2021-12-18 ENCOUNTER — Emergency Department (HOSPITAL_COMMUNITY)
Admission: EM | Admit: 2021-12-18 | Discharge: 2021-12-19 | Disposition: A | Discharge status: Home / Self Care | Payer: No Typology Code available for payment source | Attending: Emergency Medicine | Admitting: Emergency Medicine

## 2021-12-18 DIAGNOSIS — F332 Major depressive disorder, recurrent severe without psychotic features: Secondary | ICD-10-CM | POA: Diagnosis present

## 2021-12-18 DIAGNOSIS — Z20822 Contact with and (suspected) exposure to covid-19: Secondary | ICD-10-CM | POA: Diagnosis not present

## 2021-12-18 DIAGNOSIS — F32A Depression, unspecified: Secondary | ICD-10-CM | POA: Diagnosis present

## 2021-12-18 DIAGNOSIS — R519 Headache, unspecified: Secondary | ICD-10-CM | POA: Diagnosis not present

## 2021-12-18 DIAGNOSIS — I10 Essential (primary) hypertension: Secondary | ICD-10-CM | POA: Diagnosis not present

## 2021-12-18 DIAGNOSIS — R45851 Suicidal ideations: Secondary | ICD-10-CM | POA: Diagnosis not present

## 2021-12-18 DIAGNOSIS — R45850 Homicidal ideations: Secondary | ICD-10-CM

## 2021-12-18 DIAGNOSIS — Z8546 Personal history of malignant neoplasm of prostate: Secondary | ICD-10-CM | POA: Insufficient documentation

## 2021-12-18 DIAGNOSIS — Z765 Malingerer [conscious simulation]: Secondary | ICD-10-CM

## 2021-12-18 DIAGNOSIS — F4310 Post-traumatic stress disorder, unspecified: Secondary | ICD-10-CM | POA: Diagnosis not present

## 2021-12-18 DIAGNOSIS — Z7982 Long term (current) use of aspirin: Secondary | ICD-10-CM | POA: Diagnosis not present

## 2021-12-18 DIAGNOSIS — M79602 Pain in left arm: Secondary | ICD-10-CM | POA: Insufficient documentation

## 2021-12-18 DIAGNOSIS — M542 Cervicalgia: Secondary | ICD-10-CM | POA: Diagnosis not present

## 2021-12-18 DIAGNOSIS — Z79899 Other long term (current) drug therapy: Secondary | ICD-10-CM | POA: Insufficient documentation

## 2021-12-18 LAB — CBC
HCT: 41.3 % (ref 39.0–52.0)
Hemoglobin: 14.5 g/dL (ref 13.0–17.0)
MCH: 30.7 pg (ref 26.0–34.0)
MCHC: 35.1 g/dL (ref 30.0–36.0)
MCV: 87.5 fL (ref 80.0–100.0)
Platelets: 273 10*3/uL (ref 150–400)
RBC: 4.72 MIL/uL (ref 4.22–5.81)
RDW: 13.8 % (ref 11.5–15.5)
WBC: 5 10*3/uL (ref 4.0–10.5)
nRBC: 0 % (ref 0.0–0.2)

## 2021-12-18 LAB — RESP PANEL BY RT-PCR (FLU A&B, COVID) ARPGX2
Influenza A by PCR: NEGATIVE
Influenza B by PCR: NEGATIVE
SARS Coronavirus 2 by RT PCR: NEGATIVE

## 2021-12-18 LAB — TROPONIN I (HIGH SENSITIVITY)
Troponin I (High Sensitivity): 7 ng/L (ref <18)
Troponin I (High Sensitivity): 7 ng/L (ref <18)

## 2021-12-18 LAB — BASIC METABOLIC PANEL
Anion gap: 8 (ref 5–15)
BUN: 10 mg/dL (ref 6–20)
CO2: 26 mmol/L (ref 22–32)
Calcium: 9.6 mg/dL (ref 8.9–10.3)
Chloride: 102 mmol/L (ref 98–111)
Creatinine, Ser: 0.91 mg/dL (ref 0.61–1.24)
GFR, Estimated: >60 mL/min (ref >60)
Glucose, Bld: 108 mg/dL — ABNORMAL HIGH (ref 70–99)
Potassium: 3.9 mmol/L (ref 3.5–5.1)
Sodium: 136 mmol/L (ref 135–145)

## 2021-12-18 LAB — MAGNESIUM: Magnesium: 2 mg/dL (ref 1.7–2.4)

## 2021-12-18 MED ORDER — ASPIRIN EC 81 MG PO TBEC
81.0000 mg | DELAYED_RELEASE_TABLET | Freq: Every day | ORAL | Status: DC
  Administered 2021-12-19: 81 mg via ORAL
  Filled 2021-12-18: qty 1

## 2021-12-18 MED ORDER — IBUPROFEN 200 MG PO TABS: 600.0000 mg | ORAL_TABLET | Freq: Four times a day (QID) | ORAL | Status: DC | PRN

## 2021-12-18 MED ORDER — QUETIAPINE FUMARATE 300 MG PO TABS
300.0000 mg | ORAL_TABLET | Freq: Every evening | ORAL | Status: DC | PRN
  Filled 2021-12-18: qty 1

## 2021-12-18 MED ORDER — DULOXETINE HCL 60 MG PO CPEP
60.0000 mg | ORAL_CAPSULE | Freq: Every day | ORAL | Status: DC
  Administered 2021-12-19: 60 mg via ORAL
  Filled 2021-12-18: qty 1

## 2021-12-18 MED ORDER — GABAPENTIN 300 MG PO CAPS
300.0000 mg | ORAL_CAPSULE | Freq: Two times a day (BID) | ORAL | Status: DC
  Administered 2021-12-19: 300 mg via ORAL
  Filled 2021-12-18: qty 1

## 2021-12-18 MED ORDER — METHOCARBAMOL 500 MG PO TABS: 500.0000 mg | ORAL_TABLET | Freq: Two times a day (BID) | ORAL | Status: DC | PRN

## 2021-12-18 MED ORDER — IBUPROFEN 400 MG PO TABS
600.0000 mg | ORAL_TABLET | Freq: Once | ORAL | Status: AC
  Administered 2021-12-18: 600 mg via ORAL
  Filled 2021-12-18: qty 1

## 2021-12-18 MED ORDER — ACETAMINOPHEN 325 MG PO TABS
650.0000 mg | ORAL_TABLET | Freq: Four times a day (QID) | ORAL | Status: DC | PRN
Start: 2021-12-18 — End: 2021-12-19
  Administered 2021-12-19: 650 mg via ORAL
  Filled 2021-12-18: qty 2

## 2021-12-18 MED ORDER — AMLODIPINE BESYLATE 5 MG PO TABS
10.0000 mg | ORAL_TABLET | Freq: Every day | ORAL | Status: DC
  Administered 2021-12-19: 10 mg via ORAL
  Filled 2021-12-18: qty 2

## 2021-12-18 MED ORDER — TAMSULOSIN HCL 0.4 MG PO CAPS
0.4000 mg | ORAL_CAPSULE | Freq: Every day | ORAL | Status: DC
  Administered 2021-12-19: 0.4 mg via ORAL
  Filled 2021-12-18: qty 1

## 2021-12-18 MED ORDER — TRAZODONE HCL 50 MG PO TABS: 50.0000 mg | ORAL_TABLET | Freq: Every evening | ORAL | Status: DC | PRN

## 2021-12-18 NOTE — ED Notes (Signed)
Pt on video call with TTS in room 11 ?
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
## 2021-12-18 NOTE — ED Notes (Signed)
Pt sleeping at present, no distress noted. Respirations even & unlabored.  Monitoring for safety. 

## 2021-12-18 NOTE — BH Assessment (Addendum)
@  1800, requested patient's nurse Almyra Free, RN) to place the TTS machine in patient's room.  ? ?'@1810'$ , informed patient's nurse Almyra Free, RN) that if machine is moved to a room, will attempt to see patient before shift changes.  ? ?'@1812'$ , Informed by patient's nurse Almyra Free, RN) that patient is in the hallway. She will reach out to the charge nurse to see if they are able to find a room for patient.  ? ?'@1825'$ , informed patient's nurse Almyra Free, NP) that it's nearing the end of this Clinician's shift, depending on how long it takes to set up the TTS machine, patient may have to wait until the next shift.  ? ?'@1840'$ , informed patient is ready to be seen, machine to be set up. Made his nurse aware that it wouldn't be enough time to assess and complete an assessment. Will notify next shift that patient needs to be seen and the TTS machine is set up for his TTS assessment.  ? ?'@1845'$ , patient's nurse requesting patient to be seen by TTS ASAP, he is in a room temporarily just for his TTS assessment, consuming two rooms. Due to volume of patient's in the  [facility], the room(s) is needed. Informed his nurse that this information would be relayed in shift report.  ?
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
## 2021-12-18 NOTE — ED Notes (Signed)
Report given to Chris RN

## 2021-12-18 NOTE — ED Notes (Signed)
Pt given a turkey sandwich and ginger ale 

## 2021-12-18 NOTE — ED Notes (Signed)
Pt complains of pain when moving and walking around. Pt is back in the bed resting. ?

## 2021-12-18 NOTE — ED Notes (Signed)
Pt placed in room 11 with TTS machine temporarily for patient privacy during call ?
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
## 2021-12-18 NOTE — ED Notes (Signed)
Pt given muffin and cereal but refused. Wanted his meal heated up from the night before. Pt was given juice as well. ?

## 2021-12-18 NOTE — Discharge Instructions (Signed)
Take all medications as prescribed. Keep all follow-up appointments as scheduled.  Do not consume alcohol or use illegal drugs while on prescription medications. Report any adverse effects from your medications to your primary care provider promptly.  In the event of recurrent symptoms or worsening symptoms, call 911, a crisis hotline, or go to the nearest emergency department for evaluation.   

## 2021-12-19 ENCOUNTER — Encounter (HOSPITAL_COMMUNITY): Payer: Self-pay | Admitting: Registered Nurse

## 2021-12-19 DIAGNOSIS — F332 Major depressive disorder, recurrent severe without psychotic features: Secondary | ICD-10-CM

## 2021-12-19 DIAGNOSIS — F4310 Post-traumatic stress disorder, unspecified: Secondary | ICD-10-CM

## 2021-12-19 DIAGNOSIS — R45850 Homicidal ideations: Secondary | ICD-10-CM

## 2021-12-19 DIAGNOSIS — Z765 Malingerer [conscious simulation]: Secondary | ICD-10-CM
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
## 2021-12-19 NOTE — ED Notes (Signed)
Belongings returned to patient.

## 2021-12-19 NOTE — Progress Notes (Signed)
Patient given a bus pass and a cab voucher to get back to his hotel and then back to the bus station to catch his ride back to [place].  ?

## 2021-12-19 NOTE — ED Notes (Signed)
Patient verbalizes understanding of discharge instructions. Opportunity for questioning and answers were provided. Armband removed by staff, pt discharged from ED.  

## 2021-12-19 NOTE — BH Assessment (Signed)
[facility] Assessment Progress Note ?  ?Per Shuvon Rankin, NP, this pt does not require psychiatric hospitalization at this time.  Pt is psychiatrically cleared.  Discharge instructions include referral information for VA services.  Pt's nurse, Paden, has been notified. ? ?Jalene Mullet, MA ?Triage Specialist ?[phone]  ?

## 2021-12-19 NOTE — Discharge Instructions (Addendum)
For your behavioral health needs you are advised to follow up with the [facility] or the [place] facility that serves your community: ? ?     [facility] ?     [address] ?     [place], [place] [address] ?     [phone]  ? ?For other supportive services for veterans in the [place] area, contact the [facility]: ? ?     [facility] ?     [address] ?     [place], [place] [address] ?     [phone] ?     Hours of operation: 8:00 am - 7:00 pm, Monday - Friday ? ?For crisis services for veterans, call the [employer], available 24 hours a day, 7 days a week: ? ?     [place] ?     [phone]  ?

## 2021-12-24 ENCOUNTER — Encounter (HOSPITAL_COMMUNITY): Payer: Self-pay | Admitting: Emergency Medicine

## 2021-12-24 ENCOUNTER — Emergency Department (HOSPITAL_COMMUNITY)
Admission: EM | Admit: 2021-12-24 | Discharge: 2021-12-24 | Disposition: A | Discharge status: Home / Self Care | Payer: No Typology Code available for payment source | Attending: Emergency Medicine | Admitting: Emergency Medicine

## 2021-12-24 ENCOUNTER — Other Ambulatory Visit: Payer: Self-pay

## 2021-12-24 ENCOUNTER — Ambulatory Visit (HOSPITAL_COMMUNITY)
Admission: EM | Admit: 2021-12-24 | Discharge: 2021-12-24 | Disposition: A | Discharge status: Home / Self Care | Payer: No Typology Code available for payment source

## 2021-12-24 DIAGNOSIS — F1410 Cocaine abuse, uncomplicated: Secondary | ICD-10-CM | POA: Diagnosis not present

## 2021-12-24 DIAGNOSIS — Z7982 Long term (current) use of aspirin: Secondary | ICD-10-CM | POA: Insufficient documentation

## 2021-12-24 DIAGNOSIS — Z79899 Other long term (current) drug therapy: Secondary | ICD-10-CM | POA: Insufficient documentation

## 2021-12-24 DIAGNOSIS — F1210 Cannabis abuse, uncomplicated: Secondary | ICD-10-CM | POA: Insufficient documentation

## 2021-12-24 DIAGNOSIS — M542 Cervicalgia: Secondary | ICD-10-CM | POA: Insufficient documentation

## 2021-12-24 DIAGNOSIS — M549 Dorsalgia, unspecified: Secondary | ICD-10-CM | POA: Diagnosis present

## 2021-12-24 DIAGNOSIS — F1910 Other psychoactive substance abuse, uncomplicated: Secondary | ICD-10-CM | POA: Diagnosis not present

## 2021-12-24 DIAGNOSIS — R45851 Suicidal ideations: Secondary | ICD-10-CM | POA: Insufficient documentation

## 2021-12-24 DIAGNOSIS — Y900 Blood alcohol level of less than 20 mg/100 ml: Secondary | ICD-10-CM | POA: Diagnosis not present

## 2021-12-24 DIAGNOSIS — R45850 Homicidal ideations: Secondary | ICD-10-CM | POA: Diagnosis not present

## 2021-12-24 DIAGNOSIS — I10 Essential (primary) hypertension: Secondary | ICD-10-CM | POA: Diagnosis not present

## 2021-12-24 DIAGNOSIS — Z046 Encounter for general psychiatric examination, requested by authority: Secondary | ICD-10-CM | POA: Diagnosis present

## 2021-12-24 LAB — CBC
HCT: 43.9 % (ref 39.0–52.0)
Hemoglobin: 14.5 g/dL (ref 13.0–17.0)
MCH: 29.7 pg (ref 26.0–34.0)
MCHC: 33 g/dL (ref 30.0–36.0)
MCV: 89.8 fL (ref 80.0–100.0)
Platelets: 285 10*3/uL (ref 150–400)
RBC: 4.89 MIL/uL (ref 4.22–5.81)
RDW: 13.4 % (ref 11.5–15.5)
WBC: 6.5 10*3/uL (ref 4.0–10.5)
nRBC: 0 % (ref 0.0–0.2)

## 2021-12-24 LAB — RAPID URINE DRUG SCREEN, HOSP PERFORMED
Amphetamines: NOT DETECTED
Barbiturates: NOT DETECTED
Benzodiazepines: NOT DETECTED
Cocaine: POSITIVE — AB
Opiates: NOT DETECTED
Tetrahydrocannabinol: POSITIVE — AB

## 2021-12-24 LAB — COMPREHENSIVE METABOLIC PANEL
ALT: 19 U/L (ref 0–44)
AST: 26 U/L (ref 15–41)
Albumin: 4 g/dL (ref 3.5–5.0)
Alkaline Phosphatase: 41 U/L (ref 38–126)
Anion gap: 10 (ref 5–15)
BUN: 12 mg/dL (ref 6–20)
CO2: 26 mmol/L (ref 22–32)
Calcium: 9.7 mg/dL (ref 8.9–10.3)
Chloride: 101 mmol/L (ref 98–111)
Creatinine, Ser: 1.13 mg/dL (ref 0.61–1.24)
GFR, Estimated: >60 mL/min (ref >60)
Glucose, Bld: 91 mg/dL (ref 70–99)
Potassium: 3.6 mmol/L (ref 3.5–5.1)
Sodium: 137 mmol/L (ref 135–145)
Total Bilirubin: 0.8 mg/dL (ref 0.3–1.2)
Total Protein: 6.7 g/dL (ref 6.5–8.1)

## 2021-12-24 LAB — ACETAMINOPHEN LEVEL: Acetaminophen (Tylenol), Serum: <10 ug/mL — ABNORMAL LOW (ref 10–30)

## 2021-12-24 LAB — SALICYLATE LEVEL: Salicylate Lvl: <7 mg/dL — ABNORMAL LOW (ref 7.0–30.0)

## 2021-12-24 LAB — ETHANOL: Alcohol, Ethyl (B): <10 mg/dL (ref <10)

## 2021-12-24 MED ORDER — IBUPROFEN 800 MG PO TABS
800.0000 mg | ORAL_TABLET | Freq: Once | ORAL | Status: AC
  Administered 2021-12-24: 800 mg via ORAL
  Filled 2021-12-24: qty 1

## 2021-12-24 NOTE — ED Triage Notes (Signed)
Pt states that he is having SI x 2 days due the right sided pain that is chronic. Pt states that he is out of all his pain medications.  ?

## 2021-12-24 NOTE — ED Notes (Signed)
Patient was seen by the provider and discharged with resources. Patient was given a bus pass for transportation.  ?

## 2021-12-24 NOTE — ED Provider Notes (Signed)
?[facility] ?Provider Note ? ? ?CSN: [id number] ?Arrival date & time: 12/24/21  1924 ? ?  ? ?History ? ?Chief Complaint  ?Patient presents with  ? Psychiatric Evaluation  ? ? ?Clifford Edwards is a 61 y.o. male. ? ?Patient states he is feeling suicidal.  Having some chronic back pain.  Denies any fevers or chills.  Denies any plan.  History of depression.  Denies any alcohol or drug use today.  Has history of PTSD, hypertension.  Denies any shortness of breath, chest pain, abdominal pain.  No nausea or vomiting. ? ?The history is provided by the patient.  ? ?  ? ?Home Medications ?Prior to Admission medications   ?Medication Sig Start Date End Date Taking? Authorizing Provider  ?acetaminophen (TYLENOL) 650 MG CR tablet Take 650 mg by mouth every 8 (eight) hours as needed for pain.    [provider]  ?amLODipine (NORVASC) 10 MG tablet Take 10 mg by mouth daily.    [provider]  ?aspirin EC 81 MG tablet Take 81 mg by mouth daily. Swallow whole.    [provider]  ?Cholecalciferol (VITAMIN D-3) 25 MCG (1000 UT) CAPS Take 1,000 Units by mouth daily.    [provider]  ?DULoxetine (CYMBALTA) 60 MG capsule Take 60 mg by mouth daily. 12/02/21   [provider]  ?gabapentin (NEURONTIN) 300 MG capsule Take 300 mg by mouth 2 (two) times daily. 09/21/21   [provider]  ?ibuprofen (ADVIL) 800 MG tablet Take 800 mg by mouth daily.    [provider]  ?methocarbamol (ROBAXIN) 500 MG tablet Take 500 mg by mouth 2 (two) times daily as needed for muscle spasms. 09/21/21   [provider]  ?Omega-3 Fatty Acids (FISH OIL) 1000 MG CPDR Take 1,000 mg by mouth daily.    [provider]  ?QUEtiapine (SEROQUEL) 300 MG tablet Take 300 mg by mouth at bedtime as needed (sleep).    [provider]  ?tamsulosin (FLOMAX) 0.4 MG CAPS capsule Take 0.4 mg by mouth daily.    [provider]  ?traZODone (DESYREL) 50  MG tablet Take 50 mg by mouth at bedtime as needed for sleep. 12/02/21   [provider]  ?   ? ?Allergies    ?Penicillins, Hydrocodone, and Oxycodone-acetaminophen   ? ?Review of Systems   ?Review of Systems ? ?Physical Exam ?Updated Vital Signs ?BP 109/84 (BP Location: Right Arm)   Pulse 92   Temp 97.9 ?F (36.6 ?C) (Oral)   Resp 18   Ht 6' (1.829 m)   Wt 91.6 kg   SpO2 100%   BMI 27.39 kg/m?  ?Physical Exam ?Vitals and nursing note reviewed.  ?Constitutional:   ?   General: He is not in acute distress. ?   Appearance: He is well-developed.  ?HENT:  ?   Head: Normocephalic and atraumatic.  ?Eyes:  ?   Extraocular Movements: Extraocular movements intact.  ?   Conjunctiva/sclera: Conjunctivae normal.  ?   Pupils: Pupils are equal, round, and reactive to light.  ?Cardiovascular:  ?   Rate and Rhythm: Normal rate and regular rhythm.  ?   Pulses: Normal pulses.  ?   Heart sounds: Normal heart sounds. No murmur heard. ?Pulmonary:  ?   Effort: Pulmonary effort is normal. No respiratory distress.  ?   Breath sounds: Normal breath sounds.  ?Abdominal:  ?   Palpations: Abdomen is soft.  ?   Tenderness: There is no  abdominal tenderness.  ?Musculoskeletal:     ?   General: No swelling or tenderness.  ?   Cervical back: Neck supple.  ?Skin: ?   General: Skin is warm and dry.  ?   Capillary Refill: Capillary refill takes less than 2 seconds.  ?Neurological:  ?   General: No focal deficit present.  ?   Mental Status: He is alert and oriented to person, place, and time.  ?   Cranial Nerves: No cranial nerve deficit.  ?   Sensory: No sensory deficit.  ?   Motor: No weakness.  ?   Coordination: Coordination normal.  ?Psychiatric:     ?   Mood and Affect: Mood normal.  ?   Comments: Passive suicidal thoughts but no plan  ? ? ?ED Results / Procedures / Treatments   ?Labs ?(all labs ordered are listed, but only abnormal results are displayed) ?Labs Reviewed  ?SALICYLATE LEVEL - Abnormal; Notable for the following  components:  ?    Result Value  ? Salicylate Lvl <0.7 (*)   ? All other components within normal limits  ?ACETAMINOPHEN LEVEL - Abnormal; Notable for the following components:  ? Acetaminophen (Tylenol), Serum <10 (*)   ? All other components within normal limits  ?RAPID URINE DRUG SCREEN, HOSP PERFORMED - Abnormal; Notable for the following components:  ? Cocaine POSITIVE (*)   ? Tetrahydrocannabinol POSITIVE (*)   ? All other components within normal limits  ?COMPREHENSIVE METABOLIC PANEL  ?ETHANOL  ?CBC  ? ? ?EKG ?None ? ?Radiology ?No results found. ? ?Procedures ?Procedures  ? ? ?Medications Ordered in ED ?Medications  ?ibuprofen (ADVIL) tablet 800 mg (800 mg Oral Given 12/24/21 2108)  ? ? ?ED Course/ Medical Decision Making/ A&P ?  ?                        ?Medical Decision Making ?Amount and/or Complexity of Data Reviewed ?Labs: ordered. ? ?Risk ?Prescription drug management. ? ? ?Clifford Edwards is here with suicidal thoughts, back pain.  Overall chronic back pain.  No numbness or weakness.  Neurologically is intact.  No midline spinal pain.  Upon chart review patient was just evaluated by behavioral health about an hour ago.  Did not recommend inpatient treatment.  Patient homeless and overall suspect that this could be more of a social visit.  He is wanting food.  Lab work has already been collected and is overall per my review and interpretation unremarkable.  No significant anemia, electrolyte abnormality, kidney injury.  Drug screen positive for cocaine but patient is clinically sober.  Patient is able to eat and drink without any issues.  Given Motrin.  Discharged in good condition.  Normal vitals. ? ?This chart was dictated using voice recognition software.  Despite best efforts to proofread,  errors can occur which can change the documentation meaning.  ? ? ? ? ? ? ? ?Final Clinical Impression(s) / ED Diagnoses ?Final diagnoses:  ?Polysubstance abuse ([place])  ?Suicidal thoughts  ? ? ?Rx / DC Orders ?ED  Discharge Orders   ? ? None  ? ?  ? ? ?  ?Lennice Sites, DO ?12/24/21 2217 ? ?
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
## 2021-12-24 NOTE — Discharge Instructions (Addendum)
Patient is instructed prior to discharge to: Take all medications as prescribed by his/her mental healthcare provider. ?Report any adverse effects and or reactions from the medicines to his/her outpatient provider promptly. ?Patient has been instructed & cautioned: To not engage in alcohol and or illegal drug use while on prescription medicines. ?In the event of worsening symptoms, patient is instructed to call the crisis hotline, 911 and or go to the nearest ED for appropriate evaluation and treatment of symptoms. ?To follow-up with his/her primary care provider for your other medical issues, concerns and or health care needs. ? ?Please contact one of the following facilities to start medication management and therapy services:  ? ?[facility] ?[address]  ?[place], [place] [address] ?[phone]  ? ?[place]  ?[place], [place] [address] ?[phone] ? ?[facility]  ?[place], [place], [place] [address] ?[phone] ? ?[place]  ?[address]  ?[place], [place] [address] ?[phone] ? ?[facility]  ?[address] ?[place], [place] [address] ?[phone] ? ?[facility] [place]  ?[address],  ?[place], [place] [address] ?[phone]  ?   ? ?Substance Abuse Treatment Programs ? ?Intensive Outpatient Programs ?[place]     ?[address]      ?[place], [place]                   ?[phone]      ? ?[facility] ?[address] ?[place], [place] ?[phone] ? ?[facility]     ?(Inpatient and outpatient)     ?[address]           ?[phone]   ? ?[place] ?[phone] (Suboxone and Methadone) ? ?[address], [place] [address]      ?[phone]      ? ?[address] ?[place],  [place] ?[place] ? ?[facility] (Outpatient/Inpatient, [facility])    ?(insurance only) [phone]      ?       ?[facility] (Groups & Residential) ?[place], [place] ?[phone] ? ?   ?[facility]     ?[place], [place]      ?[phone]      ? ?[facility] (for caregivers and family) ?[address] ?[place], [place] ?[phone] ? ? ? ? ? ?Residential Treatment Programs ?[place]      ?[address] [place], [place] [address]  ?[phone]      ? ?[facility] ?[address], [place] [address] ?[phone] ? ?[facility]        ?[phone]      ? ?[facility] ?[phone] ? ?[facility]             ?[address]                                         ?[place], [place]                                                ?  [phone] or [phone]                              ? ?[facility] ?[address] ?[place], [address] ?[phone] ? ?[facility]    ?[place], [place]     ?[phone]      ? ?[facility] ?[place], [place] ?[phone] ? ?[facility]   ?[address]     ?[place], [place] [address]     ?[phone]      ?Admissions: 8am-3pm M-F ? ?[facility] ([facility]) ?[address] ?[place], [place] ?[phone] ? ?[facility]: Residential Program (90 Days)   ?[place], [place]      ?[phone] or [phone]    ? ?[facility]: [place], [place] ?(Walk in Hours over the weekend or by referral) ? ?[facility] ?[place], [place], [place] [address] ?[phone] ? ?Crisis Mobile: [facility]:  [phone] (for crisis response 24 hours a day) ?[place] Hotline:      9314195700 ?Outpatient Psychiatry and Counseling ? ?[facility]: Mobile Crisis Management 24 hours:  [phone] ? ?Family  Services of the [employer] sliding scale fee and walk in schedule: M-F 8am-12pm/1pm-3pm ?[place]  ?[place], [place] [address] ?[phone] ? ?[facility] ?[address] ?[place], [place] [address] ?[phone] ? ?[facility] ([facility])- new patient walk-in appointments available Monday - Friday 8am -3pm.          ?[address] ?[place], [place] [address] ?[phone] or crisis line- [phone] ? ?[facility]/ Intensive Outpatient Therapy Program ?[address] ?[place], [place] [address] ?[phone] ? ?[place]                  ?Crisis Services      ?[phone]      ?[address]     ?[place], [place] [address]                ? ?[facility]   ?[facility] ?[phone] ?[address] ?[place], [place] [address] ? ? ?[facility]          ?[address],  ?[place], [place] [address]       ?[phone] ? ?[facility] ?[address] ?[place], [place] [address] ?[phone] ? ?[facility]    ?[address]    ?[place], [place] [address]     ?[phone]      ? ?Sheralyn Boatman, MD     ?[address]     ?[place] [address]     ?[phone]     ?  ?[place] ?[place] [address] ? ?[facility]     ?[address]     ?[place], [place]      ?[phone]      ? ?[place] ?Marlou Sa, MD ?[address] ?[place], [place] [address] ?[phone] ? ?[facility]     ?[address] [phone]     ?[place], [place] [address]     ?[phone]      ? ?[facility] ?[place], [place] [address] ?[phone] ?Resources for Temporary  Residential Assistance/Crisis Centers ? ?DAY CENTERS ?[facility]) ?M-F 8am-3pm   ?[address] [place], [place]  [address]   [phone] ?Services include: laundry, barbering, support groups, case management, phone  & computer access, showers, AA/NA mtgs, mental health/substance abuse nurse, job skills class, disability information, VA assistance, spiritual classes, etc.  ? ?[facility] ? ?[employer]     ?[place]   ?[address], [place]     ?[phone]       ?       ?[facility] (women and children)       ?[place], [place] [address] ?[phone] ?[email or web address] for application and process ?Application Required ? ?[facility]   ?[address]    ?[place] [address]     ?[phone]       ?             ?[facility] ?[address] ?[place], [place] [address] ?[phone] ?[phone]([email or web address] application appt.) ?Application Required ? ?[employer] (women only)    ?[place]     ?[place], [place] [address]     ?[phone]      ?Intake starts 6pm daily ?Need valid ID, SSC, & Police report ?[facility] ?[address] ?[place], [place] ?[phone] ?Application Required ? ?[employer] (men only)     ?[place]      [place], [place]     ?[phone]      ? ?[facility] ?(Pregnant women only) ?[place], [place] ?[phone] ? ?[facility]      ?[address]      [place], [place] [address]     ?[phone]      ?       ?[facility] ?[place], [place] ?[phone] ?90 day commitment/SA/Application process ? ?[facility](men only)     ?[place]     ?[place], [place]     ?[phone]       ?Check-in at 7pm     ?       ?[facility] ?[address] ?[place],  [address] ?[phone] ?Men/Women/Women and Children must be there by 7 pm ? ?[facility] ?[place], [place] ?[phone]                ? ?
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
## 2021-12-25 ENCOUNTER — Encounter (HOSPITAL_COMMUNITY): Payer: Self-pay | Admitting: Emergency Medicine

## 2021-12-25 ENCOUNTER — Emergency Department (HOSPITAL_COMMUNITY)
Admission: EM | Admit: 2021-12-25 | Discharge: 2021-12-25 | Disposition: A | Discharge status: Home / Self Care | Payer: No Typology Code available for payment source | Attending: Emergency Medicine | Admitting: Emergency Medicine

## 2021-12-25 ENCOUNTER — Other Ambulatory Visit: Payer: Self-pay

## 2021-12-25 DIAGNOSIS — M542 Cervicalgia: Secondary | ICD-10-CM

## 2021-12-25 MED ORDER — IBUPROFEN 800 MG PO TABS
800.0000 mg | ORAL_TABLET | Freq: Once | ORAL | Status: AC
Start: 2021-12-25 — End: 2021-12-25
  Administered 2021-12-25: 800 mg via ORAL
  Filled 2021-12-25: qty 1

## 2021-12-25 NOTE — Discharge Instructions (Signed)
You may take over-the-counter Tylenol or ibuprofen as needed for your neck pain. ?

## 2021-12-25 NOTE — ED Notes (Signed)
All belongings returned to patient

## 2021-12-25 NOTE — ED Notes (Signed)
Pt verbalized understanding of d/c instructions, meds, and followup care. Denies questions. VSS, no distress noted. Steady gait to exit with all belongings.  ?

## 2021-12-25 NOTE — ED Provider Notes (Signed)
?[facility] ?Provider Note ? ? ?CSN: [id number] ?Arrival date & time: 12/24/21  2355 ? ?  ? ?History ? ?Chief Complaint  ?Patient presents with  ? Back Pain  ? ? ?Clifford Edwards is a 61 y.o. male. ? ?The history is provided by the patient and medical records. No language interpreter was used.  ?Back Pain ? ?61 year old male generally history of chronic back pain, PTSD, hypertension, depression, malingering, presenting complaining of back pain.  Please note patient was just discharged from the ER for the same complaint and checked right back pain.  Furthermore, patient also was seen by behavioral health yesterday for homicidal ideation and he did not meet inpatient criteria for admission.  Patient report he is having pain to his neck for "quite a while".  He also noted some swelling to his neck today.  No fever no numbness no recent injury. ? ?Home Medications ?Prior to Admission medications   ?Medication Sig Start Date End Date Taking? Authorizing Provider  ?acetaminophen (TYLENOL) 650 MG CR tablet Take 650 mg by mouth every 8 (eight) hours as needed for pain.    [provider]  ?amLODipine (NORVASC) 10 MG tablet Take 10 mg by mouth daily.    [provider]  ?aspirin EC 81 MG tablet Take 81 mg by mouth daily. Swallow whole.    [provider]  ?Cholecalciferol (VITAMIN D-3) 25 MCG (1000 UT) CAPS Take 1,000 Units by mouth daily.    [provider]  ?DULoxetine (CYMBALTA) 60 MG capsule Take 60 mg by mouth daily. 12/02/21   [provider]  ?gabapentin (NEURONTIN) 300 MG capsule Take 300 mg by mouth 2 (two) times daily. 09/21/21   [provider]  ?ibuprofen (ADVIL) 800 MG tablet Take 800 mg by mouth daily.    [provider]  ?methocarbamol (ROBAXIN) 500 MG tablet Take 500 mg by mouth 2 (two) times daily as needed for muscle spasms. 09/21/21   [provider]  ?Omega-3 Fatty Acids (FISH OIL) 1000 MG CPDR Take 1,000  mg by mouth daily.    [provider]  ?QUEtiapine (SEROQUEL) 300 MG tablet Take 300 mg by mouth at bedtime as needed (sleep).    [provider]  ?tamsulosin (FLOMAX) 0.4 MG CAPS capsule Take 0.4 mg by mouth daily.    [provider]  ?traZODone (DESYREL) 50 MG tablet Take 50 mg by mouth at bedtime as needed for sleep. 12/02/21   [provider]  ?   ? ?Allergies    ?Penicillins, Hydrocodone, and Oxycodone-acetaminophen   ? ?Review of Systems   ?Review of Systems  ?Musculoskeletal:  Positive for back pain.  ?All other systems reviewed and are negative. ? ?Physical Exam ?Updated Vital Signs ?BP (!) 136/91 (BP Location: Left Arm)   Pulse 73   Temp 97.7 ?F (36.5 ?C) (Oral)   Resp 18   SpO2 99%  ?Physical Exam ?Vitals and nursing note reviewed.  ?Constitutional:   ?   General: He is not in acute distress. ?   Appearance: He is well-developed.  ?   Comments: Patient resting comfortably appears to be in no acute discomfort.  ?HENT:  ?   Head: Atraumatic.  ?Eyes:  ?   Conjunctiva/sclera: Conjunctivae normal.  ?Musculoskeletal:  ?   Cervical back: Normal range of motion and neck supple. Tenderness (Patient has an old scar noted to his posterior cervical region with tenderness to palpation but no surrounding erythema edema or warmth appreciated.  Neck with full range of motion.) present.  ?Skin: ?   Findings: No rash.  ?Neurological:  ?   Mental Status: He is alert.  ? ? ?ED Results / Procedures / Treatments   ?Labs ?(all labs ordered are listed, but only abnormal results are displayed) ?Labs Reviewed - No data to display ? ?EKG ?None ? ?Radiology ?No results found. ? ?Procedures ?Procedures  ? ? ?Medications Ordered in ED ?Medications - No data to display ? ?ED Course/ Medical Decision Making/ A&P ?  ?                        ?Medical Decision Making ? ?BP (!) 136/91 (BP Location: Left Arm)   Pulse 73   Temp 97.7 ?F (36.5 ?C) (Oral)   Resp 18   SpO2 99%  ? ?34:61 AM ?60 year old  male significant history of malingering, chronic back pain, presenting complaining of pain to the back of his neck.  He was seen earlier today for back pain and was just discharged but checked back in, now complaint of neck pain.  I have reviewed patient's prior charts and considered in my plan of care.  On exam, he has some tenderness to his posterior cervical spine but no overlying skin changes to suggest cellulitis.  No recent injury concerning for fracture or dislocation.  He is able to range his neck appropriately.  Will give ibuprofen but otherwise patient is stable for discharge.  Suspect component of malingering. ? ? ? ? ? ? ? ?Final Clinical Impression(s) / ED Diagnoses ?Final diagnoses:  ?Neck pain  ? ? ?Rx / DC Orders ?ED Discharge Orders   ? ? None  ? ?  ? ? ?  ?Domenic Moras, PA-C ?12/25/21 0157 ? ?  ?Fatima Blank, MD ?12/25/21 0801 ? ?

## 2021-12-25 NOTE — ED Triage Notes (Signed)
Pt just discharged. Went out into the lobby and checked back in for back pain. Pt states EDP told him to follow up with the [place].  ?
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
